# Patient Record
Sex: Female | Born: 1945 | Race: White | Hispanic: No | Smoking: Never smoker
Health system: Southern US, Community
[De-identification: ages and names within clinical notes are randomized; demographics above are authoritative.]

## PROBLEM LIST (undated history)

## (undated) DIAGNOSIS — F32A Depression, unspecified: Secondary | ICD-10-CM

## (undated) DIAGNOSIS — H04123 Dry eye syndrome of bilateral lacrimal glands: Secondary | ICD-10-CM

## (undated) DIAGNOSIS — K219 Gastro-esophageal reflux disease without esophagitis: Secondary | ICD-10-CM

## (undated) DIAGNOSIS — E559 Vitamin D deficiency, unspecified: Secondary | ICD-10-CM

## (undated) DIAGNOSIS — G250 Essential tremor: Secondary | ICD-10-CM

## (undated) DIAGNOSIS — R682 Dry mouth, unspecified: Secondary | ICD-10-CM

## (undated) DIAGNOSIS — K760 Fatty (change of) liver, not elsewhere classified: Secondary | ICD-10-CM

## (undated) DIAGNOSIS — J302 Other seasonal allergic rhinitis: Secondary | ICD-10-CM

## (undated) DIAGNOSIS — G43909 Migraine, unspecified, not intractable, without status migrainosus: Secondary | ICD-10-CM

## (undated) DIAGNOSIS — R739 Hyperglycemia, unspecified: Secondary | ICD-10-CM

## (undated) DIAGNOSIS — E119 Type 2 diabetes mellitus without complications: Secondary | ICD-10-CM

## (undated) DIAGNOSIS — R519 Headache, unspecified: Secondary | ICD-10-CM

## (undated) DIAGNOSIS — R51 Headache: Secondary | ICD-10-CM

## (undated) DIAGNOSIS — G4730 Sleep apnea, unspecified: Secondary | ICD-10-CM

## (undated) DIAGNOSIS — R072 Precordial pain: Secondary | ICD-10-CM

## (undated) DIAGNOSIS — K649 Unspecified hemorrhoids: Secondary | ICD-10-CM

## (undated) DIAGNOSIS — F419 Anxiety disorder, unspecified: Secondary | ICD-10-CM

## (undated) DIAGNOSIS — R059 Cough, unspecified: Secondary | ICD-10-CM

## (undated) DIAGNOSIS — E669 Obesity, unspecified: Secondary | ICD-10-CM

## (undated) DIAGNOSIS — R7303 Prediabetes: Secondary | ICD-10-CM

## (undated) DIAGNOSIS — M25561 Pain in right knee: Secondary | ICD-10-CM

## (undated) DIAGNOSIS — I10 Essential (primary) hypertension: Secondary | ICD-10-CM

## (undated) DIAGNOSIS — K8010 Calculus of gallbladder with chronic cholecystitis without obstruction: Secondary | ICD-10-CM

## (undated) DIAGNOSIS — F329 Major depressive disorder, single episode, unspecified: Secondary | ICD-10-CM

## (undated) DIAGNOSIS — R05 Cough: Secondary | ICD-10-CM

## (undated) HISTORY — PX: TONSILLECTOMY AND ADENOIDECTOMY: SUR1326

## (undated) HISTORY — DX: Hyperglycemia, unspecified: R73.9

## (undated) HISTORY — DX: Pain in right knee: M25.561

## (undated) HISTORY — DX: Essential (primary) hypertension: I10

## (undated) HISTORY — DX: Anxiety disorder, unspecified: F41.9

## (undated) HISTORY — DX: Obesity, unspecified: E66.9

## (undated) HISTORY — DX: Unspecified hemorrhoids: K64.9

## (undated) HISTORY — PX: CARPAL TUNNEL RELEASE: SHX101

## (undated) HISTORY — DX: Essential tremor: G25.0

## (undated) HISTORY — DX: Prediabetes: R73.03

## (undated) HISTORY — DX: Migraine, unspecified, not intractable, without status migrainosus: G43.909

## (undated) HISTORY — DX: Vitamin D deficiency, unspecified: E55.9

## (undated) HISTORY — DX: Depression, unspecified: F32.A

## (undated) HISTORY — DX: Gastro-esophageal reflux disease without esophagitis: K21.9

## (undated) HISTORY — DX: Other seasonal allergic rhinitis: J30.2

## (undated) HISTORY — DX: Major depressive disorder, single episode, unspecified: F32.9

## (undated) HISTORY — PX: TUBAL LIGATION: SHX77

## (undated) HISTORY — PX: OTHER SURGICAL HISTORY: SHX169

## (undated) HISTORY — DX: Calculus of gallbladder with chronic cholecystitis without obstruction: K80.10

## (undated) HISTORY — DX: Precordial pain: R07.2

---

## 1995-11-10 HISTORY — PX: INCONTINENCE SURGERY: SHX676

## 2000-07-04 ENCOUNTER — Emergency Department (HOSPITAL_COMMUNITY): Admission: EM | Admit: 2000-07-04 | Discharge: 2000-07-04 | Payer: Self-pay | Admitting: Emergency Medicine

## 2000-07-04 ENCOUNTER — Encounter: Payer: Self-pay | Admitting: Emergency Medicine

## 2002-07-06 ENCOUNTER — Other Ambulatory Visit: Admission: RE | Admit: 2002-07-06 | Discharge: 2002-07-06 | Payer: Self-pay | Admitting: Obstetrics and Gynecology

## 2003-07-11 ENCOUNTER — Other Ambulatory Visit: Admission: RE | Admit: 2003-07-11 | Discharge: 2003-07-11 | Payer: Self-pay | Admitting: Obstetrics and Gynecology

## 2004-08-06 ENCOUNTER — Other Ambulatory Visit: Admission: RE | Admit: 2004-08-06 | Discharge: 2004-08-06 | Payer: Self-pay | Admitting: Obstetrics and Gynecology

## 2004-10-06 ENCOUNTER — Encounter (INDEPENDENT_AMBULATORY_CARE_PROVIDER_SITE_OTHER): Payer: Self-pay | Admitting: Specialist

## 2004-10-06 ENCOUNTER — Ambulatory Visit (HOSPITAL_COMMUNITY): Admission: RE | Admit: 2004-10-06 | Discharge: 2004-10-06 | Payer: Self-pay | Admitting: Gastroenterology

## 2006-11-09 HISTORY — PX: TRIGGER FINGER RELEASE: SHX641

## 2006-11-17 ENCOUNTER — Encounter: Admission: RE | Admit: 2006-11-17 | Discharge: 2006-11-17 | Payer: Self-pay | Admitting: Gastroenterology

## 2008-08-30 ENCOUNTER — Ambulatory Visit (HOSPITAL_BASED_OUTPATIENT_CLINIC_OR_DEPARTMENT_OTHER): Admission: RE | Admit: 2008-08-30 | Discharge: 2008-08-30 | Payer: Self-pay | Admitting: Orthopedic Surgery

## 2009-01-03 ENCOUNTER — Ambulatory Visit (HOSPITAL_BASED_OUTPATIENT_CLINIC_OR_DEPARTMENT_OTHER): Admission: RE | Admit: 2009-01-03 | Discharge: 2009-01-03 | Payer: Self-pay | Admitting: Family Medicine

## 2009-01-03 ENCOUNTER — Ambulatory Visit: Payer: Self-pay | Admitting: Diagnostic Radiology

## 2009-02-26 ENCOUNTER — Ambulatory Visit (HOSPITAL_BASED_OUTPATIENT_CLINIC_OR_DEPARTMENT_OTHER): Admission: RE | Admit: 2009-02-26 | Discharge: 2009-02-26 | Payer: Self-pay | Admitting: Orthopedic Surgery

## 2011-02-18 LAB — POCT I-STAT, CHEM 8
BUN: 15 mg/dL (ref 6–23)
Calcium, Ion: 1.11 mmol/L — ABNORMAL LOW (ref 1.12–1.32)
Chloride: 106 mEq/L (ref 96–112)
Creatinine, Ser: 0.9 mg/dL (ref 0.4–1.2)
Glucose, Bld: 108 mg/dL — ABNORMAL HIGH (ref 70–99)
HCT: 47 % — ABNORMAL HIGH (ref 36.0–46.0)
Hemoglobin: 16 g/dL — ABNORMAL HIGH (ref 12.0–15.0)
Potassium: 4.1 mEq/L (ref 3.5–5.1)
Sodium: 139 mEq/L (ref 135–145)
TCO2: 25 mmol/L (ref 0–100)

## 2011-03-24 NOTE — Op Note (Signed)
NAMEKEARY, Heidi Byrd                ACCOUNT NO.:  [id number]   MEDICAL RECORD NO.:  [id number]          PATIENT TYPE:  AMB   LOCATION:  [place]                          FACILITY:  [facility]   PHYSICIAN:  Daryll Brod, M.D.       DATE OF BIRTH:  07/03/1946   DATE OF PROCEDURE:  08/30/2008  DATE OF DISCHARGE:                               OPERATIVE REPORT   PREOPERATIVE DIAGNOSIS:  Stenosing tenosynovitis, right thumb, index,  and middle fingers.   POSTOPERATIVE DIAGNOSIS:  Stenosing tenosynovitis, right thumb, index,  and middle fingers.   OPERATION:  Release of A1 pulleys, right thumb, index, and middle  fingers.   SURGEON:  Daryll Brod, MD   ASSISTANT:  Dimas Millin, RN   ANESTHESIA:  Forearm-based IV regional.   ANESTHESIOLOGIST:  Crissie Sickles. Conrad [place], MD.   HISTORY:  The patient is a 65 year old female referred by Dr. __________  for consultation with respect triggering of her index, middle, and  thumb.  She has a locked thumb, unable to fully flex or extend.  She is  desirous of surgical intervention with respect to these.  She is aware  of risks and complications including infection, recurrence injury to  arteries, nerves, tendons, incomplete relief of symptoms, and dystrophy.  In the preoperative area, the patient is seen.  The extremity was marked  by both the patient and surgeon.  Antibiotic was given.   PROCEDURE:  The patient was brought to the operating room where forearm-  based IV regional anesthetic was carried out without difficulty under  the direction of Dr. Conrad [place].  A time-out was taken.  Prep was done with  DuraPrep and after adequate anesthesia was afforded to the patient, a  transverse incision was made over the A1 pulley of the right thumb and  carried down through the subcutaneous tissue.  Neurovascular structures  were identified and protected.  Retractors were placed.  An incision was  then made on the radial aspect of the A1 pulley.  This was found to be  extremely tight  with an area of swelling of the tendon just proximal to  the A1 pulley.  The oblique pulley was left intact.  Thumb was easily  able to place through a full range of motion.  Wound was irrigated.  A  separate incision was then made over the A1 pulley of the index finger,  this was oblique in nature and carried down through the subcutaneous  tissue.  Bleeders were again electrocauterized.  Neurovascular  structures were identified and protected.  The A1 pulley was released on  its radial aspect.  A small incision was made centrally in A2.  __________ significant tenosynovitis was present.  This was debrided.  There was adherence between the 2 tendons through the tenosynovial  tissue.  A separate incision was then made over the middle finger A1  pulley and carried down through the subcutaneous tissue, oblique  incision having been opened.  Neurovascular structures were protected  with retractors.  The A1 pulley was then released in its radial aspect.  Again, a small incision was made in the  central aspect of A2.  The  fingers were placed through a full range motion.  No further triggering  was evident.  The wounds were copiously irrigated with saline.  Skin was  closed interrupted 5-0 Vicryl Rapide sutures.  A sterile  compressive dressing was applied with the fingers free.  The patient  tolerated the procedure well and was taken to the recovery room for  observation in satisfactory condition.   She will be discharged home to return to the [place]  in 1 week on Vicodin. __________           ______________________________  Daryll Brod, M.D.     GK/MEDQ  D:  08/30/2008  T:  08/31/2008  Job:  [id number]   cc:   ?

## 2011-03-24 NOTE — Op Note (Signed)
Heidi Byrd, STANTZ                ACCOUNT NO.:  [id number]   MEDICAL RECORD NO.:  [id number]          PATIENT TYPE:  AMB   LOCATION:  [place]                          FACILITY:  [facility]   PHYSICIAN:  Daryll Brod, M.D.       DATE OF BIRTH:  Jan 02, 1946   DATE OF PROCEDURE:  02/26/2009  DATE OF DISCHARGE:                               OPERATIVE REPORT   PREOPERATIVE DIAGNOSIS:  Stenosing tenosynovitis, left thumb.   POSTOPERATIVE DIAGNOSIS:  Stenosing tenosynovitis, left thumb.   OPERATION:  Release of A1 pulley, left thumb.   SURGEON:  Daryll Brod, MD   ASSISTANT:  Dimas Millin, RN   ANESTHESIA:  General.   ANESTHESIOLOGIST:  Ala Dach, MD   HISTORY:  The patient is a 65 year old female with history of triggering  bilateral thumbs, this has not responded to conservative treatment.  She  has undergone release of the right.  She was now admitted for release of  the left.  Pre, peri, and postoperative course have been discussed along  with risks and complications.  She is aware that there is no guarantee  with surgery, possibility of infection, recurrence injury to arteries,  nerves, tendons, complete relief of symptoms, dystrophy.  In  preoperative area, the patient is seen.  The extremity marked by both  the patient and surgeon.  Antibiotic given.   PROCEDURE:  The patient was brought to the operating room where general  anesthetic was carried out under the direction of Dr. Orene Desanctis.  She was  prepped using ChloraPrep, supine position, left arm free.  A 3-minute  dry time was taken.  Time-out was then taken confirming the patient  procedure.  She was draped.  The limb was exsanguinated with an Esmarch  bandage, tourniquet was placed on the forearm, was inflated to 230 mmHg.  Transverse incision was made over the A1 pulley of left thumb, carried  down through subcutaneous tissue.  Neurovascular structures were  identified and retracted.  A release was then performed to the A1 pulley  in its radial aspect.  The oblique pulley was protected and left intact.  The wound was copiously irrigated with saline, locally infiltrated with  cord percent Marcaine without epinephrine.  The wound was closed with  interrupted 4-0 Vicryl  Rapide sutures.  Sterile compressive dressing was applied.  The patient  tolerated the procedure well, was taken to the recovery for observation  in satisfactory condition.  She will be discharged home to return to  [place] in 1 week on ibuprofen and Darvocet-N 100.           ______________________________  Daryll Brod, M.D.     GK/MEDQ  D:  02/26/2009  T:  02/26/2009  Job:  [id number]   cc:   Herbie Baltimore L. Maceo Pro, M.D.

## 2011-03-27 NOTE — Op Note (Signed)
[name], [name]                ACCOUNT NO.:  [id number]   MEDICAL RECORD NO.:  [id number]          PATIENT TYPE:  AMB   LOCATION:  [place]                         FACILITY:  [facility]   PHYSICIAN:  Ronald Lobo, M.D.   DATE OF BIRTH:  May 01, 1946   DATE OF PROCEDURE:  10/06/2004  DATE OF DISCHARGE:                                 OPERATIVE REPORT   PROCEDURE:  Colonoscopy with biopsy and directed submucosal injection.   INDICATIONS FOR PROCEDURE:  A 65 year old female for colon cancer screening.  There is a family history of colon polyps.   FINDINGS:  Several small polyps removed plus an erythematous, friable larger  polypoid lesion near the rectosigmoid junction.  Sigmoid diverticulosis.   DESCRIPTION OF PROCEDURE:  The nature, purpose, and risks of the procedure  had been discussed with the patient who provided written consent.  Total  sedation for this procedure and the upper endoscopy which preceded it was  Fentanyl 125 mcg and Versed 12 mg IV to a level of mild-to-moderate sedation  without clinical instability.  The Olympus adult videocolonoscope was  advanced to the cecum using a little bit of external abdominal compression  to enter the base of the cecum.  The terminal ileum was not entered, but the  orifice of the ileocecal valve was observed and appeared normal.   Pullback was then performed.  The quality of the prep was quite good, with  just a little bit of adherent stool in the cecum which was partially  irrigated away and which is not felt to have obscured any significant  lesions.   The main finding on this exam was at 20 cm from the external anal opening,  and consisted of a roughly 8 x 20-mm erythematous, friable polypoid, soft,  fleshy mass with overlying white exudate as is sometimes seen with  hamartomas.  Several biopsies were obtained, and it bled rather freely.  It  was quite soft.  It was rather sessile and could not readily be separated  from the  underlying tissues, so I elected not to attempt to snare it off  within knowing more about its histologic character.   Nearby were numerous sigmoid diverticula noted during removal of the scope.  During removal of the scope, I injected SPOT Niger ink type solution to mark  the location of this lesion.  It is felt that the full biopsies of it did  not excise it in its entirety, so in case high-grade dysplasia were present,  the submucosal injections may help a surgeon localize the lesion.   There were also some diminutive sessile polyps a slight distance proximal to  this main lesion in the distal sigmoid region.   The remainder of the colon was unremarkable.  No frank cancer or other  polyps were noted.   Retroflexion in the rectum and reinspection of the rectum were unremarkable.   The patient tolerated the procedure well, and there were no apparent  complications.   IMPRESSION:  1.  Polypoid erythematous lesion at 20 cm of uncertain clinical      significance, pathology pending and  site marked, as described above.  2.  Diminutive distal sigmoid sessile polyps which appear grossly      hyperplastic in character.  3.  Moderate sigmoid diverticulosis.  4.  Family history of colon polyps.   PLAN:  Await pathology results.      RB/MEDQ  D:  10/06/2004  T:  10/06/2004  Job:  [id number]   cc:   Herbie Baltimore L. Maceo Pro, M.D.  [address] [place]  [place] [address]  Fax: [phone]

## 2011-03-27 NOTE — Op Note (Signed)
[name], Heidi Byrd                ACCOUNT NO.:  [id number]   MEDICAL RECORD NO.:  [id number]          PATIENT TYPE:  AMB   LOCATION:  [place]                         FACILITY:  [facility]   PHYSICIAN:  Ronald Lobo, M.D.   DATE OF BIRTH:  02/01/1946   DATE OF PROCEDURE:  10/06/2004  DATE OF DISCHARGE:                                 OPERATIVE REPORT   PROCEDURE:  Upper endoscopy with biopsy.   INDICATIONS:  Follow up of previous gastric polyps diagnosed in [place], in  a patient with a history of reflux maintained on PPI therapy.   FINDINGS:  Medium sized mid-gastric polyps biopsied.   PROCEDURE:  The nature, purpose and risks of the procedure were reviewed  with the patient and provided consent.  Sedation with Fentanyl 50 mcg, and  Versed 7 mg IV.  Without any clinical instability, the Olympus video  endoscope was passed under direct vision.  The vocal cords were not well  seen.  The esophagus was removed without any significant difficulty and he  had entirely normal mucosa down to the squamocolumnar junction which was  located essentially at the area of the lower esophageal sphincter, without  any hiatal hernia evident.   There was no free reflux or evidence of reflux esophagitis, Barrett's  esophagus, varices, infection or neoplasia.  No rings or strictures were  seen.   The stomach contained a minimal fluid residual which was suctioned up.  There were some medium sized, roughly 1 cm (fleshy gastric polyps in the mid-  stomach, several of which I cold biopsied).  There were probably 6 or 8 such  polyps present.  No frank gastric mass was appreciated and there was no  evidence of gastritis, erosions or ulcers including a retropexy with a  proximal stomach.  The pylorus and duodenal vault and second duodenum looked  normal.   The patient tolerated the procedure well with no apparent complications.   IMPRESSION:  1.  Medium sized gastric polyps in the mid-body of the stomach  (211.1).  2.  No evidence of reflux induced sequelae in the esophagus.   PLAN:  Await pathology of gastric polyps.      RB/MEDQ  D:  10/06/2004  T:  10/06/2004  Job:  [id number]   cc:   Herbie Baltimore L. Maceo Pro, M.D.  [address] [place]  [place] [address]  Fax: [phone]

## 2011-05-12 ENCOUNTER — Other Ambulatory Visit: Payer: Self-pay | Admitting: Obstetrics and Gynecology

## 2011-08-11 LAB — BASIC METABOLIC PANEL
CO2: 31
Calcium: 9.6
GFR calc Af Amer: >60
Sodium: 140

## 2011-08-11 LAB — POCT HEMOGLOBIN-HEMACUE: Hemoglobin: 14.1

## 2011-12-02 ENCOUNTER — Other Ambulatory Visit (HOSPITAL_BASED_OUTPATIENT_CLINIC_OR_DEPARTMENT_OTHER): Payer: Self-pay | Admitting: Family Medicine

## 2011-12-03 ENCOUNTER — Ambulatory Visit (HOSPITAL_BASED_OUTPATIENT_CLINIC_OR_DEPARTMENT_OTHER)
Admission: RE | Admit: 2011-12-03 | Discharge: 2011-12-03 | Disposition: A | Discharge status: Home / Self Care | Payer: PRIVATE HEALTH INSURANCE | Source: Ambulatory Visit | Attending: Family Medicine | Admitting: Family Medicine

## 2011-12-03 DIAGNOSIS — R1032 Left lower quadrant pain: Secondary | ICD-10-CM

## 2011-12-03 DIAGNOSIS — K7689 Other specified diseases of liver: Secondary | ICD-10-CM | POA: Insufficient documentation

## 2011-12-03 DIAGNOSIS — K5732 Diverticulitis of large intestine without perforation or abscess without bleeding: Secondary | ICD-10-CM | POA: Insufficient documentation

## 2011-12-03 DIAGNOSIS — R197 Diarrhea, unspecified: Secondary | ICD-10-CM | POA: Insufficient documentation

## 2011-12-03 DIAGNOSIS — R109 Unspecified abdominal pain: Secondary | ICD-10-CM | POA: Insufficient documentation

## 2011-12-03 MED ORDER — IOHEXOL 300 MG/ML  SOLN
100.0000 mL | Freq: Once | INTRAMUSCULAR | Status: AC | PRN
  Administered 2011-12-03: 100 mL via INTRAVENOUS

## 2013-09-14 ENCOUNTER — Ambulatory Visit (INDEPENDENT_AMBULATORY_CARE_PROVIDER_SITE_OTHER): Payer: Medicare Other | Admitting: Cardiology

## 2013-09-14 ENCOUNTER — Encounter: Payer: Self-pay | Admitting: Cardiology

## 2013-09-14 VITALS — BP 132/84 | HR 63 | Ht 63.0 in | Wt 229.0 lb

## 2013-09-14 DIAGNOSIS — R072 Precordial pain: Secondary | ICD-10-CM

## 2013-09-14 HISTORY — DX: Precordial pain: R07.2

## 2013-09-14 NOTE — Progress Notes (Signed)
HPI The patient presents for evaluation of chest discomfort. She has no prior cardiac history. She reports an episode of chest discomfort nothing one week ago. This was unlike her previous reflux. He was a upper left chest discomfort. It was 4/10 in intensity. It was not worse with movement or deep breathing. There was no radiation to the neck or the jaw or the arm. It was dull with some sporadic shooting pains. There was no associated symptoms such as nausea vomiting or diaphoresis. There were no palpitations, presyncope or syncope. It came on spontaneously and when we after about 3 hours. She has not had any of this before. She's not had any of this since then. She did see her primary care doctor and there was some subtle T-wave change apparently on the EKG though I don't have this one for comparison.   Allergies  Allergen Reactions  . Hydrocodone-Acetaminophen     DYSPNEA    Current Outpatient Prescriptions  Medication Sig Dispense Refill  . bisoprolol-hydrochlorothiazide (ZIAC) 10-6.25 MG per tablet Take 1 tablet by mouth daily.      . Calcium Carbonate-Vitamin D (CALCIUM + D PO) Take 600 mg by mouth daily.      Marland Kitchen esomeprazole (NEXIUM) 20 MG capsule Take 20 mg by mouth daily at 12 noon.      . fexofenadine (ALLEGRA) 180 MG tablet Take 180 mg by mouth as needed.       Marland Kitchen ibuprofen (ADVIL,MOTRIN) 800 MG tablet Take 800 mg by mouth every 8 (eight) hours as needed.       No current facility-administered medications for this visit.    Past Medical History  Diagnosis Date  . Hypertension   . GERD (gastroesophageal reflux disease)   . Seasonal allergic reaction   . Kidney stones     Past Surgical History  Procedure Laterality Date  . Scr colon      Family History  Problem Relation Age of Onset  . Diabetes Mother   . Hypertension Mother   . Osteoporosis Mother     History   Social History  . Marital Status: Divorced    Spouse Name: N/A    Number of Children: N/A  . Years  of Education: N/A   Occupational History  . Not on file.   Social History Main Topics  . Smoking status: Never Smoker   . Smokeless tobacco: Not on file  . Alcohol Use: No  . Drug Use: No  . Sexual Activity: Not on file   Other Topics Concern  . Not on file   Social History Narrative  . No narrative on file    ROS:  Joint pains, headaches, reflux. Otherwise as stated in the HPI and negative for all other systems.  PHYSICAL EXAM BP 132/84  Pulse 63  Ht 5' 3"  (1.6 m)  Wt 229 lb (103.874 kg)  BMI 40.58 kg/m2 GENERAL:  Well appearing HEENT:  Pupils equal round and reactive, fundi not visualized, oral mucosa unremarkable NECK:  No jugular venous distention, waveform within normal limits, carotid upstroke brisk and symmetric, no bruits, no thyromegaly LYMPHATICS:  No cervical, inguinal adenopathy LUNGS:  Clear to auscultation bilaterally BACK:  No CVA tenderness CHEST:  Unremarkable HEART:  PMI not displaced or sustained,S1 and S2 within normal limits, no S3, no S4, no clicks, no rubs, no murmurs ABD:  Flat, positive bowel sounds normal in frequency in pitch, no bruits, no rebound, no guarding, no midline pulsatile mass, no hepatomegaly, no splenomegaly EXT:  2 plus pulses throughout, no edema, no cyanosis no clubbing SKIN:  No rashes no nodules NEURO:  Cranial nerves II through XII grossly intact, motor grossly intact throughout PSYCH:  Cognitively intact, oriented to person place and time   EKG: Sinus rhythm, rate 63, axis within normal limits, intervals within normal limits, no acute ST-T wave changes.  09/14/2013   ASSESSMENT AND PLAN  CHEST PAIN:  I think the pretest probability of obstructive coronary disease is relatively low. I will bring the patient back for a POET (Plain Old Exercise Test). This will allow me to screen for obstructive coronary disease, risk stratify and very importantly provide a prescription for exercise.  OVERWEIGHT:  The patient understands the  need to lose weight with diet and exercise. We have discussed specific strategies for this.

## 2013-09-14 NOTE — Patient Instructions (Signed)
Your physician has requested that you have an exercise tolerance test. Can be done with NP or PA. For further information please visit [email or web address] Please also follow instruction sheet, as given.  Your physician recommends that you schedule a follow-up appointment as needed with Dr. Antoine Poche

## 2013-09-26 ENCOUNTER — Encounter: Payer: Medicare Other | Admitting: Cardiology

## 2013-11-27 ENCOUNTER — Ambulatory Visit (INDEPENDENT_AMBULATORY_CARE_PROVIDER_SITE_OTHER): Payer: Medicare Other | Admitting: Cardiology

## 2013-11-27 DIAGNOSIS — R072 Precordial pain: Secondary | ICD-10-CM

## 2013-11-27 NOTE — Progress Notes (Signed)
Exercise Treadmill Test  Pre-Exercise Testing Evaluation Rhythm: normal sinus  Rate: 71     Test  Exercise Tolerance Test Ordering MD: Marijo File, MD  Interpreting MD: Marijo File, MD  Unique Test No: 1  Treadmill:  1  Indication for ETT: precordial chest pain  Contraindication to ETT: No   Stress Modality: exercise - treadmill  Cardiac Imaging Performed: non   Protocol: standard Bruce - maximal  Max BP:  216/84  Max MPHR (bpm):  155 85% MPR (bpm):  132  MPHR obtained (bpm):  136 % MPHR obtained:  88  Reached 85% MPHR (min:sec):  5:10 Total Exercise Time (min-sec):  5:30  Workload in METS:  7 Borg Scale: 18  Reason ETT Terminated:  desired heart rate attained    ST Segment Analysis At Rest: normal ST segments - no evidence of significant ST depression With Exercise: no evidence of significant ST depression  Other Information Arrhythmia:  No Angina during ETT:  absent (0) Quality of ETT:  diagnostic  ETT Interpretation:  normal - no evidence of ischemia by ST analysis  Comments: The patient had poor exercise tolerance.  There was no chest pain.  There was an appropriate level of dyspnea.  There were no arrhythmias, a normal heart rate response and normal BP response.  There were no ischemic ST T wave changes and a normal heart rate recovery.  Recommendations: Negative adequate ETT.  No further testing is indicated.  Based on the above I gave the patient a prescription for exercise.

## 2014-06-14 ENCOUNTER — Ambulatory Visit
Admission: RE | Admit: 2014-06-14 | Discharge: 2014-06-14 | Disposition: A | Discharge status: Home / Self Care | Payer: Medicare Other | Source: Ambulatory Visit | Attending: Sports Medicine | Admitting: Sports Medicine

## 2014-06-14 ENCOUNTER — Encounter: Payer: Self-pay | Admitting: Sports Medicine

## 2014-06-14 ENCOUNTER — Ambulatory Visit (INDEPENDENT_AMBULATORY_CARE_PROVIDER_SITE_OTHER): Payer: Medicare Other | Admitting: Sports Medicine

## 2014-06-14 VITALS — BP 163/86 | HR 68 | Ht 63.0 in | Wt 233.0 lb

## 2014-06-14 DIAGNOSIS — M25569 Pain in unspecified knee: Secondary | ICD-10-CM

## 2014-06-14 DIAGNOSIS — M25561 Pain in right knee: Secondary | ICD-10-CM

## 2014-06-14 HISTORY — DX: Pain in right knee: M25.561

## 2014-06-14 NOTE — Progress Notes (Signed)
  Heidi Byrd - 68 y.o. female MRN [id number]  Date of birth: Oct 01, 1946  SUBJECTIVE:  Including CC & ROS.  Chief Complaint  Patient presents with  . Knee Pain    right knee pain    68 y/o obese female with PMH HTN presented with 2 weeks of right medial > lateral knee pain and tightness. No known inciting event/injury/trauma. She does note that at about the time that pain began, she had been wearing a different pair of her "nice white shoes." No locking but has appreciated a "clunking" sensation at times over the past 2 weeks. Pain aggravated by walking and going up or down stairs. Heat helped with the pain initially but does not seem to help now. Ice and ibuprofen 400 mg BID are helping now.   Of note, she had right knee pain in 2011. No imaging obtained. She was diagnosed with "internal derangement" and prescribed a HEP, which resulted in resolution of her pain.  She had left knee pain in 2014, which was diagnosed as tendinitis, per her report.    ROS: Gen: no fevers, chills, weight changes CV: no CP Resp: no SOB Neuro: no focal numbness, tingling or weakness     HISTORY: Past Medical, Surgical, Social, and Family History Reviewed & Updated per EMR.   Pertinent Historical Findings include: Obesity Pre-diabetes Right knee tendonitis  Grandfather with bone CA (somewhere in leg)  DATA REVIEWED: No prior imaging of right knee.   PHYSICAL EXAM:  VS: BP:163/86 mmHg  HR:68bpm  TEMP: ( )  RESP:   HT:5' 3"  (160 cm)   WT:233 lb (105.688 kg)  BMI:41.4 PHYSICAL EXAM: Gen: well appearing, NAD MSK: Right knee: Inspection: mild genu valgus; no overlying skin changes (erythema, bruising) Palpation: mild effusion; crepitus present; TTP most prominent at medial joint line, but also lateral joint line and tibial tubercle/insertion of patellar tensdon ROM: limited extension  Strength: 5/5 in LE Neurovascular: antalgic gait; sensation intact; well perfused  Special Tests: Thesaly's:  unable to perform 2/2 to pain (can bear weight on right leg but not flex) McMurry's: pain along lateral joint line > medial joint line; no popping palpated  Ligamentous testing: Pain along medial joint line with valgus stress. ACL, PCL, MCL and LCL intact with good endpoints.    ASSESSMENT & PLAN: See problem based charting & AVS for pt instructions.  1. Right medial knee pain - DG Knee AP/LAT W/Sunrise Right; Future  History and exam suggestive of knee osteoarthritis, possibly with degenerative medial meniscus tear. Compression sleeve applied today. Home exercises for quad and hamstring strengthening demonstrated and provided in a handout. Advised continued use of ibuprofen, as this has been helping and patient is hesitant to take medications. Obtain Xrays and f/u in 2 weeks. Will consider steroid injection at that time.    Susannah Lichtenstein, DO, HO-3

## 2014-06-14 NOTE — Patient Instructions (Signed)
Suspect this is related to arthritis in your knee and possibly a degenerative tear of your meniscus  We will obtain Xrays of your knee   Wear compression sleeve, applied today  Home exercises, as discussed and demonstrated today, paying particular attention to quads and hamstrings.   Continue ibuprofen  Return in 2 weeks. We will review the Xray and can consider steroid injection.

## 2014-06-18 ENCOUNTER — Telehealth: Payer: Self-pay | Admitting: *Deleted

## 2014-06-18 NOTE — Telephone Encounter (Signed)
Gave pt xray results, has appt with Dr. Micheline Chapman next week

## 2014-06-25 ENCOUNTER — Ambulatory Visit: Payer: Medicare Other | Admitting: Sports Medicine

## 2014-06-27 ENCOUNTER — Encounter: Payer: Self-pay | Admitting: Sports Medicine

## 2014-06-27 ENCOUNTER — Ambulatory Visit (INDEPENDENT_AMBULATORY_CARE_PROVIDER_SITE_OTHER): Payer: Medicare Other | Admitting: Sports Medicine

## 2014-06-27 VITALS — BP 141/83 | HR 68 | Ht 63.0 in | Wt 232.0 lb

## 2014-06-27 DIAGNOSIS — M25561 Pain in right knee: Secondary | ICD-10-CM

## 2014-06-27 DIAGNOSIS — M25569 Pain in unspecified knee: Secondary | ICD-10-CM

## 2014-06-28 NOTE — Progress Notes (Signed)
   Subjective:    Patient ID: Heidi Byrd, female    DOB: 14-Mar-1946, 68 y.o.   MRN: [id number]  HPI Patient comes in today for followup on right knee pain. She is still struggling with pain. In addition to pain she is also getting significant mechanical symptoms. She will feel the knee lock up on her which requires her to manipulate the knee in order to get it to "pop". After this pop she is then able to move much easier. Continues with intermittent swelling as well. She is wearing her body helix compression sleeve intermittently. It is somewhat uncomfortable. She is also taking very low doses of ibuprofen. She has been cautioned in the past by other physicians about taking high doses of anti-inflammatories. She does note that the ibuprofen helps her sleep at night.    Review of Systems     Objective:   Physical Exam Obese. No acute distress  Right knee: Range of motion is 0-120. 1+ effusion. Tender to palpation along the medial joint line with a positive McMurray's. Knee is grossly stable ligamentous exam. Neurovascularly intact distally. Walking with a limp.  X-rays of the right knee show some mild medial compartmental narrowing but not marked. Otherwise unremarkable.       Assessment & Plan:  Persistent right knee pain and swelling worrisome for medial meniscal tear  Given her persistent symptoms and physical exam I think we need to pursue an MRI scan of her right knee to rule out meniscal pathology that may benefit from arthroscopy. I will call her with those results once available at which point we will delineate further treatment. In the meantime, she will continue with activity as tolerated but is cautioned about doing too much recreational walking. I've also cautioned her about taking too much ibuprofen. She is asking about using Arnicare which is some sort of topical treatment. I think it is fine for her to try this.

## 2014-06-29 ENCOUNTER — Ambulatory Visit
Admission: RE | Admit: 2014-06-29 | Discharge: 2014-06-29 | Disposition: A | Discharge status: Home / Self Care | Payer: Medicare Other | Source: Ambulatory Visit | Attending: Sports Medicine | Admitting: Sports Medicine

## 2014-06-29 DIAGNOSIS — M25561 Pain in right knee: Secondary | ICD-10-CM

## 2014-07-02 ENCOUNTER — Telehealth: Payer: Self-pay | Admitting: Sports Medicine

## 2014-07-02 NOTE — Telephone Encounter (Signed)
I spoke with the patient on the phone today regarding MRI findings of her right knee. She has an oblique undersurface tear of the body and posterior horn of the medial meniscus with a flipped fragment in the inferior coronary recess. 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 Patient continues to experience pain and mechanical symptoms in her right knee. I think she would benefit from arthroscopy. I will refer her to Dr. Noemi Chapel to discuss this in further detail. Further treatment of this knee will be per Dr. Archie Endo discretion. Patient will followup with me when necessary.

## 2014-07-02 NOTE — Telephone Encounter (Signed)
Message copied by Thurman Coyer on Mon Jul 02, 2014  1:11 PM ------      Message from: Cyd Silence      Created: Mon Jul 02, 2014 11:21 AM      Regarding: FW: mri results      Contact: [phone]                   ----- Message -----         From: Carolyne Littles         Sent: 07/02/2014  11:08 AM           To: April Manson, CMA      Subject: mri results                                              Pt called to get her results of rt knee MRI. ------

## 2014-07-03 ENCOUNTER — Encounter: Payer: Self-pay | Admitting: *Deleted

## 2014-07-03 NOTE — Progress Notes (Signed)
Patient ID: Heidi Byrd, female   DOB: 19-Nov-1945, 68 y.o.   MRN: [id number] [place] [address] [address] DR Bonner Puna 07/05/14 @ 915AM [phone]

## 2014-10-05 ENCOUNTER — Encounter (HOSPITAL_BASED_OUTPATIENT_CLINIC_OR_DEPARTMENT_OTHER): Payer: Self-pay | Admitting: *Deleted

## 2014-10-05 ENCOUNTER — Emergency Department (HOSPITAL_BASED_OUTPATIENT_CLINIC_OR_DEPARTMENT_OTHER): Payer: Medicare Other

## 2014-10-05 ENCOUNTER — Emergency Department (HOSPITAL_BASED_OUTPATIENT_CLINIC_OR_DEPARTMENT_OTHER)
Admission: EM | Admit: 2014-10-05 | Discharge: 2014-10-05 | Disposition: A | Discharge status: Home / Self Care | Payer: Medicare Other | Attending: Emergency Medicine | Admitting: Emergency Medicine

## 2014-10-05 DIAGNOSIS — R112 Nausea with vomiting, unspecified: Secondary | ICD-10-CM | POA: Insufficient documentation

## 2014-10-05 DIAGNOSIS — Z87442 Personal history of urinary calculi: Secondary | ICD-10-CM | POA: Insufficient documentation

## 2014-10-05 DIAGNOSIS — I10 Essential (primary) hypertension: Secondary | ICD-10-CM | POA: Diagnosis not present

## 2014-10-05 DIAGNOSIS — Z79899 Other long term (current) drug therapy: Secondary | ICD-10-CM | POA: Diagnosis not present

## 2014-10-05 DIAGNOSIS — Z9851 Tubal ligation status: Secondary | ICD-10-CM | POA: Insufficient documentation

## 2014-10-05 DIAGNOSIS — Z880 Allergy status to penicillin: Secondary | ICD-10-CM | POA: Diagnosis not present

## 2014-10-05 DIAGNOSIS — Z8659 Personal history of other mental and behavioral disorders: Secondary | ICD-10-CM | POA: Insufficient documentation

## 2014-10-05 DIAGNOSIS — Z791 Long term (current) use of non-steroidal anti-inflammatories (NSAID): Secondary | ICD-10-CM | POA: Diagnosis not present

## 2014-10-05 DIAGNOSIS — R1013 Epigastric pain: Secondary | ICD-10-CM | POA: Insufficient documentation

## 2014-10-05 DIAGNOSIS — K219 Gastro-esophageal reflux disease without esophagitis: Secondary | ICD-10-CM | POA: Diagnosis not present

## 2014-10-05 LAB — COMPREHENSIVE METABOLIC PANEL
ALK PHOS: 74 U/L (ref 39–117)
ALT: 29 U/L (ref 0–35)
ANION GAP: 14 (ref 5–15)
AST: 39 U/L — ABNORMAL HIGH (ref 0–37)
Albumin: 4.1 g/dL (ref 3.5–5.2)
BUN: 15 mg/dL (ref 6–23)
CALCIUM: 10 mg/dL (ref 8.4–10.5)
CO2: 24 mEq/L (ref 19–32)
CREATININE: 0.7 mg/dL (ref 0.50–1.10)
Chloride: 101 mEq/L (ref 96–112)
GFR calc non Af Amer: 88 mL/min — ABNORMAL LOW (ref >90)
GLUCOSE: 125 mg/dL — AB (ref 70–99)
Potassium: 4.5 mEq/L (ref 3.7–5.3)
Sodium: 139 mEq/L (ref 137–147)
TOTAL PROTEIN: 8.8 g/dL — AB (ref 6.0–8.3)
Total Bilirubin: 0.8 mg/dL (ref 0.3–1.2)

## 2014-10-05 LAB — URINALYSIS, ROUTINE W REFLEX MICROSCOPIC
Bilirubin Urine: NEGATIVE
Glucose, UA: NEGATIVE mg/dL
Hgb urine dipstick: NEGATIVE
Ketones, ur: NEGATIVE mg/dL
Nitrite: NEGATIVE
Protein, ur: NEGATIVE mg/dL
Specific Gravity, Urine: 1.021 (ref 1.005–1.030)
Urobilinogen, UA: 0.2 mg/dL (ref 0.0–1.0)
pH: 6 (ref 5.0–8.0)

## 2014-10-05 LAB — CBC WITH DIFFERENTIAL/PLATELET
Basophils Absolute: 0 10*3/uL (ref 0.0–0.1)
Basophils Relative: 1 % (ref 0–1)
EOS ABS: 0.2 10*3/uL (ref 0.0–0.7)
EOS PCT: 2 % (ref 0–5)
HCT: 44.6 % (ref 36.0–46.0)
HEMOGLOBIN: 14.3 g/dL (ref 12.0–15.0)
LYMPHS ABS: 1.7 10*3/uL (ref 0.7–4.0)
Lymphocytes Relative: 23 % (ref 12–46)
MCH: 28.3 pg (ref 26.0–34.0)
MCHC: 32.1 g/dL (ref 30.0–36.0)
MCV: 88.1 fL (ref 78.0–100.0)
MONO ABS: 0.8 10*3/uL (ref 0.1–1.0)
MONOS PCT: 11 % (ref 3–12)
Neutro Abs: 4.6 10*3/uL (ref 1.7–7.7)
Neutrophils Relative %: 63 % (ref 43–77)
Platelets: 318 10*3/uL (ref 150–400)
RBC: 5.06 MIL/uL (ref 3.87–5.11)
RDW: 13.9 % (ref 11.5–15.5)
WBC: 7.2 10*3/uL (ref 4.0–10.5)

## 2014-10-05 LAB — URINE MICROSCOPIC-ADD ON

## 2014-10-05 LAB — LIPASE, BLOOD: Lipase: 34 U/L (ref 11–59)

## 2014-10-05 MED ORDER — CEFDINIR 300 MG PO CAPS: 300.0000 mg | ORAL_CAPSULE | Freq: Two times a day (BID) | ORAL | Status: DC

## 2014-10-05 MED ORDER — IOHEXOL 300 MG/ML  SOLN
100.0000 mL | Freq: Once | INTRAMUSCULAR | Status: AC | PRN
  Administered 2014-10-05: 100 mL via INTRAVENOUS

## 2014-10-05 MED ORDER — CEPHALEXIN 500 MG PO CAPS: 500.0000 mg | ORAL_CAPSULE | Freq: Three times a day (TID) | ORAL | Status: DC

## 2014-10-05 MED ORDER — IOHEXOL 300 MG/ML  SOLN
50.0000 mL | Freq: Once | INTRAMUSCULAR | Status: AC | PRN
  Administered 2014-10-05: 50 mL via ORAL

## 2014-10-05 MED ORDER — CEFACLOR 500 MG PO CAPS: 500.0000 mg | ORAL_CAPSULE | Freq: Three times a day (TID) | ORAL | Status: DC

## 2014-10-05 MED ORDER — OXYCODONE-ACETAMINOPHEN 5-325 MG PO TABS: 1.0000 | ORAL_TABLET | ORAL | Status: DC | PRN

## 2014-10-05 NOTE — ED Provider Notes (Signed)
CSN: [id number]     Arrival date & time 10/05/14  1158 History   First MD Initiated Contact with Patient 10/05/14 1238     Chief Complaint  Patient presents with  . Abdominal Pain   Heidi Byrd is a 68 y.o. female presenting with abdominal pain.   For the past 2-3 weeks she has experienced gradually worsening, constant, now severe pain mostly in the epigastrium with radiation bilaterally to the back. Associated nausea and NBNB emesis x2. Has had normally timed and formed stools that are tan in color. No change inurinary habits. No constitutional symptoms. Nothing has helped the pain. She has severe GERD and recently changed to zantac from PPI.   Has history of gallstones without cholecystectomy. No other surgeries. Never had pancreatitis. Doesn't drink alcohol. Denies illicit drugs. Has never had UTIs or STIs, last sexual encounter was in 1994.   PT IS A JEHOVAH'S WITNESS  (Consider location/radiation/quality/duration/timing/severity/associated sxs/prior Treatment) HPI  Past Medical History  Diagnosis Date  . Hypertension   . GERD (gastroesophageal reflux disease)   . Seasonal allergic reaction   . Kidney stones   . Hyperglycemia   . Depression    Past Surgical History  Procedure Laterality Date  . Scr colon    . Tonsillectomy and adenoidectomy    . Incontinence surgery    . Tubal ligation    . Carpal tunnel release      Bilateral  . Trigger finger release      x 4   Family History  Problem Relation Age of Onset  . Diabetes Mother   . Hypertension Mother   . Osteoporosis Mother   . Bone cancer Maternal Grandfather    History  Substance Use Topics  . Smoking status: Never Smoker   . Smokeless tobacco: Never Used  . Alcohol Use: No   OB History    No data available     Review of Systems  Constitutional: Negative for fever and chills.  HENT: Negative for sneezing and sore throat.   Eyes: Negative for visual disturbance.  Respiratory: Negative for cough,  shortness of breath and wheezing.   Cardiovascular: Negative for chest pain and leg swelling.  Gastrointestinal: Positive for nausea, vomiting and abdominal pain. Negative for blood in stool, abdominal distention and rectal pain.       Tan stools  Endocrine: Negative for polydipsia and polyuria.  Genitourinary: Negative for dysuria, urgency, frequency, hematuria, flank pain, vaginal bleeding, vaginal discharge and vaginal pain.  Musculoskeletal: Negative for myalgias, arthralgias and neck pain.  Skin: Negative for rash.  Allergic/Immunologic: Negative for immunocompromised state.  Neurological: Negative for syncope, light-headedness and headaches.      Allergies  Tetracyclines & related; Augmentin; Calcium channel blockers; Codeine; and Hydrocodone-acetaminophen  Home Medications   Prior to Admission medications   Medication Sig Start Date End Date Taking? Authorizing Provider  LISINOPRIL PO Take by mouth.   Yes Historical Provider, MD  ranitidine (ZANTAC) 150 MG tablet Take 150 mg by mouth 2 (two) times daily.   Yes Historical Provider, MD  bisoprolol-hydrochlorothiazide (ZIAC) 10-6.25 MG per tablet Take 1 tablet by mouth daily.    Historical Provider, MD  Calcium Carbonate-Vitamin D (CALCIUM + D PO) Take 600 mg by mouth daily.    Historical Provider, MD  esomeprazole (NEXIUM) 20 MG capsule Take 20 mg by mouth daily at 12 noon.    Historical Provider, MD  fexofenadine (ALLEGRA) 180 MG tablet Take 180 mg by mouth as needed.  Historical Provider, MD  ibuprofen (ADVIL,MOTRIN) 800 MG tablet Take 800 mg by mouth every 8 (eight) hours as needed.    Historical Provider, MD   BP 154/85 mmHg  Pulse 79  Temp(Src) 98.2 F (36.8 C) (Oral)  Resp 18  Ht 5' 3"  (1.6 m)  Wt 232 lb (105.235 kg)  BMI 41.11 kg/m2  SpO2 99% Physical Exam  Constitutional: She is oriented to person, place, and time. She appears well-developed and well-nourished. No distress.  HENT:  Mouth/Throat: Oropharynx is  clear and moist.  Eyes: Conjunctivae and EOM are normal. Pupils are equal, round, and reactive to light. No scleral icterus.  Neck: Neck supple. No JVD present.  Cardiovascular: Normal rate, regular rhythm, normal heart sounds and intact distal pulses.   No murmur heard. Pulmonary/Chest: Effort normal and breath sounds normal. No respiratory distress. She exhibits no tenderness.  Abdominal: Soft. Bowel sounds are normal. She exhibits no distension and no mass. There is tenderness (epigastric L > R ). There is no rebound and no guarding.  Musculoskeletal: Normal range of motion. She exhibits no edema or tenderness.  Lymphadenopathy:    She has no cervical adenopathy.  Neurological: She is alert and oriented to person, place, and time. She exhibits normal muscle tone.  Skin: Skin is warm and dry.  Vitals reviewed.   ED Course  Procedures (including critical care time) Labs Review Labs Reviewed  URINALYSIS, ROUTINE W REFLEX MICROSCOPIC - Abnormal; Notable for the following:    Leukocytes, UA SMALL (*)    All other components within normal limits  COMPREHENSIVE METABOLIC PANEL - Abnormal; Notable for the following:    Glucose, Bld 125 (*)    Total Protein 8.8 (*)    AST 39 (*)    GFR calc non Af Amer 88 (*)    All other components within normal limits  CBC WITH DIFFERENTIAL  LIPASE, BLOOD  URINE MICROSCOPIC-ADD ON    Imaging Review No results found.   EKG Interpretation None      MDM   Final diagnoses:  Epigastric abdominal pain    68yo with subacute symptoms suggestive of epigastric inflammation. No SIRS and no signs of hepatocellular injury (trivial elevation of AST 39) or cholestasis. No thrombocytopenia to suggest splenomegaly. Will get CT abd w/ contrast.   CT shows signs of diverticulitis in pt with known diverticulosis and left sided abdominal tenderness on exam. Will treat with antibiotics and analgesics.   Dot Lanes, MD 10/06/14 [phone]

## 2014-10-05 NOTE — ED Notes (Signed)
abd pain for weeks- c/o tan stools x 2.5 weeks- spoke with her PCP and GI doctors and was advised to come to ED for CT scan- +nausea, vomited x 2 yesterday

## 2014-10-05 NOTE — Discharge Instructions (Signed)
We think your abdominal pain is due to diverticulitis, which requires antibiotic treatment for 10 days.  - Take cefdinir as directed twice daily for 10 days Your labs checking your kidneys, liver, pancreas were all unremarkable.  - STOP taking ibuprofen, naproxen, advil and other over the counter pain relievers. You should take tylenol instead as it does not contribute to gastric ulceration.

## 2014-11-09 HISTORY — PX: EYE SURGERY: SHX253

## 2014-11-09 HISTORY — PX: KNEE SURGERY: SHX244

## 2014-11-13 ENCOUNTER — Other Ambulatory Visit: Payer: Self-pay | Admitting: Gastroenterology

## 2014-11-13 DIAGNOSIS — R1310 Dysphagia, unspecified: Secondary | ICD-10-CM

## 2014-11-20 ENCOUNTER — Ambulatory Visit
Admission: RE | Admit: 2014-11-20 | Discharge: 2014-11-20 | Disposition: A | Discharge status: Home / Self Care | Payer: Medicare Other | Source: Ambulatory Visit | Attending: Gastroenterology | Admitting: Gastroenterology

## 2014-11-20 DIAGNOSIS — R1310 Dysphagia, unspecified: Secondary | ICD-10-CM

## 2014-11-21 ENCOUNTER — Other Ambulatory Visit: Payer: Self-pay | Admitting: Gastroenterology

## 2014-11-21 DIAGNOSIS — R933 Abnormal findings on diagnostic imaging of other parts of digestive tract: Secondary | ICD-10-CM

## 2014-11-29 ENCOUNTER — Ambulatory Visit
Admission: RE | Admit: 2014-11-29 | Discharge: 2014-11-29 | Disposition: A | Discharge status: Home / Self Care | Payer: Medicare Other | Source: Ambulatory Visit | Attending: Gastroenterology | Admitting: Gastroenterology

## 2014-11-29 DIAGNOSIS — R933 Abnormal findings on diagnostic imaging of other parts of digestive tract: Secondary | ICD-10-CM

## 2014-11-29 MED ORDER — IOHEXOL 300 MG/ML  SOLN
75.0000 mL | Freq: Once | INTRAMUSCULAR | Status: AC | PRN
  Administered 2014-11-29: 75 mL via INTRAVENOUS

## 2014-12-04 ENCOUNTER — Other Ambulatory Visit: Payer: Self-pay | Admitting: Gastroenterology

## 2015-12-09 ENCOUNTER — Other Ambulatory Visit: Payer: Self-pay | Admitting: Family Medicine

## 2015-12-09 DIAGNOSIS — N644 Mastodynia: Secondary | ICD-10-CM

## 2015-12-11 ENCOUNTER — Ambulatory Visit
Admission: RE | Admit: 2015-12-11 | Discharge: 2015-12-11 | Disposition: A | Discharge status: Home / Self Care | Payer: Medicare Other | Source: Ambulatory Visit | Attending: Family Medicine | Admitting: Family Medicine

## 2015-12-11 DIAGNOSIS — N644 Mastodynia: Secondary | ICD-10-CM

## 2016-01-30 ENCOUNTER — Other Ambulatory Visit: Payer: Self-pay

## 2016-01-30 DIAGNOSIS — Z1231 Encounter for screening mammogram for malignant neoplasm of breast: Secondary | ICD-10-CM

## 2016-03-24 ENCOUNTER — Other Ambulatory Visit: Payer: Self-pay | Admitting: Obstetrics and Gynecology

## 2016-03-25 LAB — CYTOLOGY - PAP

## 2016-03-26 ENCOUNTER — Ambulatory Visit
Admission: RE | Admit: 2016-03-26 | Discharge: 2016-03-26 | Disposition: A | Discharge status: Home / Self Care | Payer: Medicare Other | Source: Ambulatory Visit

## 2016-03-26 DIAGNOSIS — Z1231 Encounter for screening mammogram for malignant neoplasm of breast: Secondary | ICD-10-CM

## 2016-04-02 ENCOUNTER — Other Ambulatory Visit: Payer: Self-pay | Admitting: Specialist

## 2016-04-14 ENCOUNTER — Encounter: Payer: Self-pay | Admitting: Neurology

## 2016-04-14 ENCOUNTER — Ambulatory Visit (INDEPENDENT_AMBULATORY_CARE_PROVIDER_SITE_OTHER): Payer: Medicare Other | Admitting: Neurology

## 2016-04-14 ENCOUNTER — Other Ambulatory Visit: Payer: Self-pay | Admitting: Neurology

## 2016-04-14 VITALS — BP 139/80 | HR 72 | Ht 63.0 in | Wt 252.0 lb

## 2016-04-14 DIAGNOSIS — G250 Essential tremor: Secondary | ICD-10-CM | POA: Diagnosis not present

## 2016-04-14 DIAGNOSIS — R413 Other amnesia: Secondary | ICD-10-CM

## 2016-04-14 DIAGNOSIS — R251 Tremor, unspecified: Secondary | ICD-10-CM

## 2016-04-14 DIAGNOSIS — R4789 Other speech disturbances: Secondary | ICD-10-CM | POA: Diagnosis not present

## 2016-04-14 MED ORDER — PRIMIDONE 50 MG PO TABS: 100.0000 mg | ORAL_TABLET | Freq: Every day | ORAL | Status: DC

## 2016-04-14 NOTE — Progress Notes (Signed)
[facility]    Provider:  Dr Jaynee Eagles Referring Provider: Dr. Olen Pel Primary Care Physician:  Orpah Melter, MD  CC:  tremor  HPI:  Heidi Byrd is a lovely 70 y.o. female here as a referral from Dr. Maceo Pro for tremors. She has a past medical history of hypertension, remote history of seizures, depression, anxiety, migraines, sleep apnea, prediabetes, obesity.  Mother had tremors too. Paternal grandparents had parkinson's disease maybe but unclear source of tremor. Mother had shaking, lorazepam helped a little. Patient's temor started 2-3 years. She notices it happens when she is performing action like holding a cup or trying to write, forget underlining. Her head shakes. Her legs also shake. Brother without tremor. No walking impairment, no other associated symptoms, no balance problems, no falls, not acting out dreams. Mother had essentia tremor. No inciting event to the tremor, no head trauma. She can't carry her cup of tea. Metoprolol did not help.  Slowly progressive. Having a hard time getting words out occasionally as well and memory loss. Tremor is significantly interfering with her life and actions.  Reviewed notes, labs and imaging from outside physicians, which showed:  His metabolic panel was unremarkable November 2016 except for elevated AST of 54, glucose 122 and sodium of 135, creatinine was normal at 0.67, TSH was normal in September 2016 1.96, vitamin D 0H was within normal limits 35.8, EDC with differential is normal.   Reviewed primary care notes, significant information includes the she was diagnosed with essential tremor, she was given metoprolol for primary hypertension which also is a medication used to treat tremor. She was referred to neurology for her tremor.  Personally reviewed EKG tracing. QTc 399. Normal sinus rhythm, normal EKG.    Review of Systems: Patient complains of symptoms per HPI as well as the following symptoms: Fatigue, birthmarks,  tremor, depression, anxiety, not enough sleep, decreased energy, disinterest in activities, sleepiness. Pertinent negatives per HPI. All others negative.   Social History   Social History  . Marital Status: Divorced    Spouse Name: N/A  . Number of Children: 2  . Years of Education: 14   Occupational History  . Retired    Social History Main Topics  . Smoking status: Never Smoker   . Smokeless tobacco: Never Used  . Alcohol Use: No  . Drug Use: No  . Sexual Activity: Not on file   Other Topics Concern  . Not on file   Social History Narrative   Mom lives with her and she is the caregiver.    Caffeine use: Rare-soda   Rare-tea    Family History  Problem Relation Age of Onset  . Diabetes Mother   . Hypertension Mother   . Osteoporosis Mother   . Bone cancer Maternal Grandfather     Past Medical History  Diagnosis Date  . Hypertension   . GERD (gastroesophageal reflux disease)   . Seasonal allergic reaction   . Kidney stones   . Hyperglycemia   . Depression     Past Surgical History  Procedure Laterality Date  . Scr colon    . Tonsillectomy and adenoidectomy    . Incontinence surgery    . Tubal ligation    . Carpal tunnel release      Bilateral  . Trigger finger release      x 4    Current Outpatient Prescriptions  Medication Sig Dispense Refill  . losartan (COZAAR) 25 MG tablet Take 25 mg by mouth daily.  0  .  metoprolol succinate (TOPROL-XL) 50 MG 24 hr tablet Take 50 mg by mouth daily.  2  . omeprazole (PRILOSEC) 40 MG capsule Take 40 mg by mouth daily.  3  . PARoxetine (PAXIL) 20 MG tablet Take 20 mg by mouth daily.  0  . primidone (MYSOLINE) 50 MG tablet Take 2 tablets (100 mg total) by mouth at bedtime. Start with 0.5 tablet at night then increase by 0.5 tablet every week. 60 tablet 11   No current facility-administered medications for this visit.    Allergies as of 04/14/2016 - Review Complete 04/14/2016  Allergen Reaction Noted  .  Tetracyclines & related Shortness Of Breath 06/14/2014  . Augmentin [amoxicillin-pot clavulanate] Nausea And Vomiting 06/14/2014  . Calcium channel blockers Other (See Comments) 06/14/2014  . Codeine Nausea Only 06/14/2014  . Hydrocodone-acetaminophen  09/14/2013    Vitals: BP 139/80 mmHg  Pulse 72  Ht 5\' 3"  (1.6 m)  Wt 252 lb (114.306 kg)  BMI 44.65 kg/m2 Last Weight:  Wt Readings from Last 1 Encounters:  04/14/16 252 lb (114.306 kg)   Last Height:   Ht Readings from Last 1 Encounters:  04/14/16 5\' 3"  (1.6 m)   Physical exam: Exam: Gen: NAD, conversant, well nourised, obese, well groomed                     CV: RRR, no MRG. No Carotid Bruits. No peripheral edema, warm, nontender Eyes: Conjunctivae clear without exudates or hemorrhage  Neuro: Detailed Neurologic Exam  Speech:    Speech is normal; fluent and spontaneous with normal comprehension.  Cognition:    The patient is oriented to person, place, and time;     recent and remote memory intact;     language fluent;     normal attention, concentration,     fund of knowledge Cranial Nerves:    The pupils are equal, round, and reactive to light. Attempted funduscopic exam could not visualize. Visual fields are full to finger confrontation. Extraocular movements are intact. Trigeminal sensation is intact and the muscles of mastication are normal. The face is symmetric. The palate elevates in the midline. Hearing intact. Voice is normal. Shoulder shrug is normal. The tongue has normal motion without fasciculations.   Coordination:    Normal finger to nose and heel to shin.   Gait:    Wide based due to body habitus  Motor Observation:  Patient was noted to have a head tremor as well as a high frequency low amplitude positional tremor increased with intention.    No asymmetry, no atrophy. Tone:    Normal muscle tone.    Posture:    Posture is normal. normal erect    Strength:    Strength is V/V in the upper and  lower limbs.      Sensation: intact to LT     Reflex Exam:  DTR's: Mild proximal leg weakness otherwise deep tendon reflexes in the upper and lower extremities are normal bilaterally.   Toes:    The toes are downgoing bilaterally.   Clonus:    Clonus is absent.       Assessment/Plan:  A very lovely 70 year old female with essential tremor. She is on metoprolol and is not helping. Will start primidone.  Memory loss, word finding difficulty and tremor: MRI of the brain. In next appointment can discuss this further, the patient describes word finding difficulty and memory changes, next appointment need to do memory testing and discuss further. In the meantime can  perform an MRI of the brain given these findings as well as tremor.  As far as your medications are concerned, I would like to suggest: Week one: 1/2 tablet at bedtime Week two: 1 tablet at bedtime Week three: 1.5 tablets at bedtime Week four: 2 tablets at bedtime  As far as diagnostic testing: Lab, MRI of the brain  I would like to see you back in 4 months, sooner if we need to. Please call us with any interim questions, concerns, problems, updates or refill requests. Email me in 6 weeks and let me know how you are doing, we can increase the primidone.  Did have long discussion about primidone with patient and significant side effects. Serious reactions can include thrombocytopenia and anemia, shortness of breath, withdrawal seizures, common reactions including ataxia, vertigo, nausea, vomiting, fatigue, nystagmus, drowsiness. Discussed that this medication can cause significant somnolence and risk for falls, patient should be very careful and start slowly and stop for any side effects at all  Cc: Dr. Staci Acosta, [facility] [address] [place], [place] [address]  Phone [phone] Fax [phone]

## 2016-04-14 NOTE — Patient Instructions (Signed)
Remember to drink plenty of fluid, eat healthy meals and do not skip any meals. Try to eat protein with a every meal and eat a healthy snack such as fruit or nuts in between meals. Try to keep a regular sleep-wake schedule and try to exercise daily, particularly in the form of walking, 20-30 minutes a day, if you can.   As far as your medications are concerned, I would like to suggest: Week one: 1/2 tablet at bedtime Week two: 1 tablet at bedtime Week three: 1.5 tablets at bedtime Week four: 2 tablets at bedtime  As far as diagnostic testing: Lab, MRI of the brain  I would like to see you back in 4 months, sooner if we need to. Please call us with any interim questions, concerns, problems, updates or refill requests. Email me in 6 weeks and let me know how you are doing, we can increase the primidone.  Our phone number is [phone]. We also have an after hours call service for urgent matters and there is a physician on-call for urgent questions. For any emergencies you know to call 911 or go to the nearest emergency room

## 2016-04-15 DIAGNOSIS — G250 Essential tremor: Secondary | ICD-10-CM

## 2016-04-15 HISTORY — DX: Essential tremor: G25.0

## 2016-04-15 LAB — THYROID PANEL WITH TSH
FREE THYROXINE INDEX: 1.7 (ref 1.2–4.9)
T3 UPTAKE RATIO: 18 % — AB (ref 24–39)
T4, Total: 9.5 ug/dL (ref 4.5–12.0)
TSH: 3.61 u[IU]/mL (ref 0.450–4.500)

## 2016-04-20 ENCOUNTER — Telehealth: Payer: Self-pay | Admitting: *Deleted

## 2016-04-20 NOTE — Telephone Encounter (Signed)
I have spoken with Heidi Byrd this morning, and per RAS, advised that labs done in our office look ok.  She verbalized understanding of same/fim

## 2016-04-20 NOTE — Telephone Encounter (Signed)
-----   Message from Melvenia Beam, MD sent at 04/18/2016  8:43 AM EDT ----- Labs unremarkable thanks

## 2016-04-26 ENCOUNTER — Ambulatory Visit
Admission: RE | Admit: 2016-04-26 | Discharge: 2016-04-26 | Disposition: A | Discharge status: Home / Self Care | Payer: Medicare Other | Source: Ambulatory Visit | Attending: Neurology | Admitting: Neurology

## 2016-04-26 DIAGNOSIS — R251 Tremor, unspecified: Secondary | ICD-10-CM

## 2016-04-26 DIAGNOSIS — R4789 Other speech disturbances: Secondary | ICD-10-CM

## 2016-04-26 DIAGNOSIS — R413 Other amnesia: Secondary | ICD-10-CM | POA: Diagnosis not present

## 2016-04-29 ENCOUNTER — Telehealth: Payer: Self-pay

## 2016-04-29 NOTE — Telephone Encounter (Signed)
-----   Message from Melvenia Beam, MD sent at 04/28/2016  6:02 PM EDT ----- MRI of the brain is normal for age. (for documentation: the empty sella is likely clinically not significant, she has no symptoms and this can be an incidental finding thanks)

## 2016-04-29 NOTE — Telephone Encounter (Signed)
I spoke to patient and she is aware of results.

## 2016-06-09 ENCOUNTER — Telehealth: Payer: Self-pay | Admitting: Neurology

## 2016-06-09 NOTE — Telephone Encounter (Signed)
Dr Ahern- please advise 

## 2016-06-09 NOTE — Telephone Encounter (Addendum)
Dr Jaynee Eagles- please advise Called pt. She stated that she believes the primidone is causing concentration problems/weird dreams/confusion. She states she did not have these sx prior to starting medication. She states she is walking in circles, she goes to pick up the phone to make a call, but does not know how to call. She tries to use the TV remote as the phone sometimes. She started the primidone right after OV with Dr Jaynee Eagles on 04/14/16. She takes 2 tablets at bedtime as prescribed. She states her mother has the same tremor and took lorazepam. She would like to be weaned off of the primidone and take lorazepam if Dr Jaynee Eagles approves. Advised Dr Jaynee Eagles is still seeing patient's for the day. I will send message to her and call back to advise by tomorrow. She verbalized understanding. She said she is leaving the house around 10am tomorrow and will not be back until 3pm and to call after 3pm.

## 2016-06-09 NOTE — Telephone Encounter (Signed)
Patient is calling. She needs to discuss medication primidone (MYSOLINE) 50 MG tablet. She states she cannot concentrate,  has weird dreams and her shaking is no better. Please call and discuss.

## 2016-06-10 ENCOUNTER — Other Ambulatory Visit: Payer: Self-pay | Admitting: Neurology

## 2016-06-10 NOTE — Addendum Note (Signed)
Addended by: Hope Pigeon on: 06/10/2016 04:24 PM   Modules accepted: Orders

## 2016-06-10 NOTE — Telephone Encounter (Signed)
Dr Jaynee Eagles- please advise Called pt back. Relayed Dr Jaynee Eagles message. Pt verbalized understanding. She is okay with trying the neurontin. She is wondering how she should wean off of the primidone, to then start the neurontin. Advised I will ask Dr Jaynee Eagles and call her back to advise. She verbalized understanding.   Pt stated she has new sx of shooting sensations in finger/other areas. Advised she should f/u with PCP about this. She verbalized understanding.

## 2016-06-10 NOTE — Telephone Encounter (Signed)
Heidi Byrd, please call patient back. Clonazepam is a benzodiazepine. This class of medication can have significant side effects including sedation, memory changes, risk for falls, withdrawal and dependence especially as we age. I would prefer to different medications possibly neurontin which also has side effects unfortunately, common such are dizziness, drowsiness, weakness, tired feeling, nausea, diarrhea, constipation, blurred vision, headache, breast swelling, dry mouth, loss of balance or coordination. We can try a low dose and then slowly increase. Let me know thanks

## 2016-06-10 NOTE — Telephone Encounter (Signed)
Dr Jaynee Eagles- can you send rx gabapentin to pt pharmacy?  I called her pharmacy and spoke to [place] and cx primidone rx.   Thank you!  Called and spoke to pt. Relayed Dr Jaynee Eagles message. She wrote down instructions and I had pt repeat instructions back to me to make sure she understood correctly. I verified pharmacy with pt. Advised Dr Jaynee Eagles will send new rx to her pharmacy.

## 2016-06-10 NOTE — Telephone Encounter (Signed)
She is on 2 pills of promidone at night. She can stop one pill for 2 weeks then stop the second pill. Start Neurontin 300mg  three times a day. Start with one pill at night for 2 days then increase to 2 pills daily for 2 days then increase to 3 pills daily. I would not start the neurontin until she is weaned off the primidone. thanks

## 2016-06-22 ENCOUNTER — Other Ambulatory Visit: Payer: Self-pay | Admitting: Neurology

## 2016-06-22 DIAGNOSIS — G250 Essential tremor: Secondary | ICD-10-CM

## 2016-06-22 MED ORDER — GABAPENTIN 300 MG PO CAPS: ORAL_CAPSULE | ORAL | 11 refills | Status: DC

## 2016-06-22 NOTE — Telephone Encounter (Signed)
Called it in; thanks

## 2016-06-22 NOTE — Telephone Encounter (Signed)
Faxed printed rx lyrica to pt pharmacy. Fax: [phone]. Received confirmation.

## 2016-06-22 NOTE — Telephone Encounter (Signed)
Dr Jaynee Eagles- can you send in rx gabapentin? I sent this previously. Thank you

## 2016-06-22 NOTE — Telephone Encounter (Signed)
Patient called to advise, Neurontin Rx still has not been sent to [facility].

## 2016-06-29 NOTE — Telephone Encounter (Signed)
Patient says she just saw Dr. [facility] in [facility] and Dr. Truman Hayward wants to know if it's okay to take 600 MG of Ibuprofen every 8 hours with Gabapentin.

## 2016-06-29 NOTE — Telephone Encounter (Signed)
Patient called regarding gabapentin (NEURONTIN) 300 MG capsule, states nurse said to take (1) at night x 2 nights, (2) at night x 2 nights, (3) at night from then on, states Rx is a little different, it says (1) at bedtime for 3 days, (1) capsule twice daily for 3 days, (1) capsule three times a day, needs to know if she is to take all 3 at bedtime or 3 times a day (morning, afternoon and night?), please call to advise.

## 2016-06-29 NOTE — Telephone Encounter (Signed)
Dr Jaynee Eagles- FYI  Called and spoke to pt. Advised the directions I had her follow were correct. She stated she is taking 3 tabs per day. Morning, afternoon, night. She should call if she has any SE, new or worsening sx. She verbalized understanding.   Stated per Dr Jaynee Eagles she can take 600mg  ibuprofen with gabapentin. She verbalized understanding.

## 2016-07-27 NOTE — Telephone Encounter (Signed)
Pt called said she is having side effects to gabapentin (NEURONTIN) 300 MG capsule . She said her voice is starting to tremor and a little bit of stuttering. She said there has been a change in mood-thoughts of "just wish I would die"- said is not suisidal. She is very tired also, wants to sleep all the time. She is wanting to stop the med. sts it has not helped the tremor. Please call

## 2016-07-27 NOTE — Telephone Encounter (Signed)
Dr Ahern- please advise 

## 2016-07-27 NOTE — Telephone Encounter (Signed)
Tell her to stop neurontin. She has a follow up in a few weeks and we can discuss next steps then thanks

## 2016-07-28 NOTE — Telephone Encounter (Signed)
Called and spoke to pt. Relayed per Dr Jaynee Eagles that she can stop neurontin and she will discuss next steps with her at her follow up. She verbalized understanding.

## 2016-08-03 ENCOUNTER — Telehealth: Payer: Self-pay | Admitting: Neurology

## 2016-08-03 NOTE — Telephone Encounter (Signed)
Noted, thank you. Pt has no upcoming appt scheduled.

## 2016-08-03 NOTE — Telephone Encounter (Addendum)
CALLED PT TO R/S 08/18/16 APPT PT STATES SHE WANTS IT CANCELED BUT NOT R/S PT FEELS SHE DOES NOT WANT TO TRY ANY OTHER MEDS.  ADDITIONAL COMMENTS FROM PT AT TIME OF CALL: "DR AHERN TOLD ME TO STOP GABAPENTIN COLD [place] AND I FELT HORRIBLE SO I AM WEANING MYSELF OFF OF IT AND DO NOT PLAN ON TRYING ANYTHING ELSE-NO MORE MEDS. PLEASE CANCEL MY APPT AND DO NOT RESCHED."

## 2016-08-03 NOTE — Telephone Encounter (Signed)
That's fine thanls

## 2016-08-18 ENCOUNTER — Ambulatory Visit: Payer: Medicare Other | Admitting: Neurology

## 2018-01-13 DIAGNOSIS — R413 Other amnesia: Secondary | ICD-10-CM | POA: Diagnosis not present

## 2018-01-13 DIAGNOSIS — I10 Essential (primary) hypertension: Secondary | ICD-10-CM | POA: Diagnosis not present

## 2018-01-13 DIAGNOSIS — K219 Gastro-esophageal reflux disease without esophagitis: Secondary | ICD-10-CM | POA: Diagnosis not present

## 2018-01-13 DIAGNOSIS — F321 Major depressive disorder, single episode, moderate: Secondary | ICD-10-CM | POA: Diagnosis not present

## 2018-03-15 DIAGNOSIS — B370 Candidal stomatitis: Secondary | ICD-10-CM | POA: Diagnosis not present

## 2018-04-11 DIAGNOSIS — H109 Unspecified conjunctivitis: Secondary | ICD-10-CM | POA: Diagnosis not present

## 2018-04-11 DIAGNOSIS — D72829 Elevated white blood cell count, unspecified: Secondary | ICD-10-CM | POA: Diagnosis not present

## 2018-04-11 DIAGNOSIS — E611 Iron deficiency: Secondary | ICD-10-CM | POA: Diagnosis not present

## 2018-04-11 DIAGNOSIS — D509 Iron deficiency anemia, unspecified: Secondary | ICD-10-CM | POA: Diagnosis not present

## 2018-04-11 DIAGNOSIS — E559 Vitamin D deficiency, unspecified: Secondary | ICD-10-CM | POA: Diagnosis not present

## 2018-04-11 DIAGNOSIS — K130 Diseases of lips: Secondary | ICD-10-CM | POA: Diagnosis not present

## 2018-04-14 DIAGNOSIS — D3132 Benign neoplasm of left choroid: Secondary | ICD-10-CM | POA: Diagnosis not present

## 2018-04-14 DIAGNOSIS — H26491 Other secondary cataract, right eye: Secondary | ICD-10-CM | POA: Diagnosis not present

## 2018-04-14 DIAGNOSIS — H43813 Vitreous degeneration, bilateral: Secondary | ICD-10-CM | POA: Diagnosis not present

## 2018-04-14 DIAGNOSIS — H524 Presbyopia: Secondary | ICD-10-CM | POA: Diagnosis not present

## 2018-05-05 DIAGNOSIS — H26491 Other secondary cataract, right eye: Secondary | ICD-10-CM | POA: Diagnosis not present

## 2018-05-18 ENCOUNTER — Other Ambulatory Visit (HOSPITAL_BASED_OUTPATIENT_CLINIC_OR_DEPARTMENT_OTHER): Payer: Self-pay | Admitting: Gastroenterology

## 2018-05-18 DIAGNOSIS — K219 Gastro-esophageal reflux disease without esophagitis: Secondary | ICD-10-CM | POA: Diagnosis not present

## 2018-05-18 DIAGNOSIS — R1010 Upper abdominal pain, unspecified: Secondary | ICD-10-CM | POA: Diagnosis not present

## 2018-05-18 DIAGNOSIS — K5792 Diverticulitis of intestine, part unspecified, without perforation or abscess without bleeding: Secondary | ICD-10-CM | POA: Diagnosis not present

## 2018-05-19 ENCOUNTER — Ambulatory Visit (HOSPITAL_BASED_OUTPATIENT_CLINIC_OR_DEPARTMENT_OTHER)
Admission: RE | Admit: 2018-05-19 | Discharge: 2018-05-19 | Disposition: A | Discharge status: Home / Self Care | Payer: PPO | Source: Ambulatory Visit | Attending: Gastroenterology | Admitting: Gastroenterology

## 2018-05-19 DIAGNOSIS — K838 Other specified diseases of biliary tract: Secondary | ICD-10-CM | POA: Diagnosis not present

## 2018-05-19 DIAGNOSIS — R1010 Upper abdominal pain, unspecified: Secondary | ICD-10-CM | POA: Insufficient documentation

## 2018-05-19 DIAGNOSIS — K760 Fatty (change of) liver, not elsewhere classified: Secondary | ICD-10-CM | POA: Diagnosis not present

## 2018-05-23 DIAGNOSIS — Z1231 Encounter for screening mammogram for malignant neoplasm of breast: Secondary | ICD-10-CM | POA: Diagnosis not present

## 2018-05-23 DIAGNOSIS — Z6841 Body Mass Index (BMI) 40.0 and over, adult: Secondary | ICD-10-CM | POA: Diagnosis not present

## 2018-05-23 DIAGNOSIS — Z01419 Encounter for gynecological examination (general) (routine) without abnormal findings: Secondary | ICD-10-CM | POA: Diagnosis not present

## 2018-05-30 DIAGNOSIS — J020 Streptococcal pharyngitis: Secondary | ICD-10-CM | POA: Diagnosis not present

## 2018-05-30 DIAGNOSIS — J029 Acute pharyngitis, unspecified: Secondary | ICD-10-CM | POA: Diagnosis not present

## 2018-06-06 DIAGNOSIS — R1084 Generalized abdominal pain: Secondary | ICD-10-CM | POA: Diagnosis not present

## 2018-06-06 DIAGNOSIS — R110 Nausea: Secondary | ICD-10-CM | POA: Diagnosis not present

## 2018-06-06 DIAGNOSIS — K5900 Constipation, unspecified: Secondary | ICD-10-CM | POA: Diagnosis not present

## 2018-06-08 DIAGNOSIS — K8020 Calculus of gallbladder without cholecystitis without obstruction: Secondary | ICD-10-CM | POA: Diagnosis not present

## 2018-06-08 DIAGNOSIS — K5900 Constipation, unspecified: Secondary | ICD-10-CM | POA: Diagnosis not present

## 2018-06-08 DIAGNOSIS — G43A0 Cyclical vomiting, not intractable: Secondary | ICD-10-CM | POA: Diagnosis not present

## 2018-06-08 DIAGNOSIS — K219 Gastro-esophageal reflux disease without esophagitis: Secondary | ICD-10-CM | POA: Diagnosis not present

## 2018-06-08 DIAGNOSIS — R1084 Generalized abdominal pain: Secondary | ICD-10-CM | POA: Diagnosis not present

## 2018-06-15 ENCOUNTER — Ambulatory Visit: Payer: Self-pay | Admitting: General Surgery

## 2018-06-15 DIAGNOSIS — K8044 Calculus of bile duct with chronic cholecystitis without obstruction: Secondary | ICD-10-CM | POA: Diagnosis not present

## 2018-06-20 ENCOUNTER — Encounter (HOSPITAL_COMMUNITY): Payer: Self-pay | Admitting: *Deleted

## 2018-06-23 DIAGNOSIS — K5900 Constipation, unspecified: Secondary | ICD-10-CM | POA: Diagnosis not present

## 2018-06-23 DIAGNOSIS — K146 Glossodynia: Secondary | ICD-10-CM | POA: Diagnosis not present

## 2018-06-23 DIAGNOSIS — K760 Fatty (change of) liver, not elsewhere classified: Secondary | ICD-10-CM | POA: Diagnosis not present

## 2018-06-23 DIAGNOSIS — K8020 Calculus of gallbladder without cholecystitis without obstruction: Secondary | ICD-10-CM | POA: Diagnosis not present

## 2018-06-23 DIAGNOSIS — R05 Cough: Secondary | ICD-10-CM | POA: Diagnosis not present

## 2018-06-23 DIAGNOSIS — K5792 Diverticulitis of intestine, part unspecified, without perforation or abscess without bleeding: Secondary | ICD-10-CM | POA: Diagnosis not present

## 2018-06-23 DIAGNOSIS — K219 Gastro-esophageal reflux disease without esophagitis: Secondary | ICD-10-CM | POA: Diagnosis not present

## 2018-06-23 DIAGNOSIS — R1010 Upper abdominal pain, unspecified: Secondary | ICD-10-CM | POA: Diagnosis not present

## 2018-06-23 NOTE — Patient Instructions (Addendum)
Asmara Backs  06/23/2018   Your procedure is scheduled on: 07-04-18  Report to [facility] Main  Entrance  Report to admitting at 1000 AM    Call this number if you have problems the morning of surgery [phone]   Remember: Do not eat food  :After Midnight.  NO SOLID FOOD AFTER MIDNIGHT THE NIGHT PRIOR TO SURGERY. NOTHING BY MOUTH EXCEPT CLEAR LIQUIDS UNTIL 3 HOURS PRIOR TO [place] SURGERY. PLEASE FINISH ENSURE DRINK PER SURGEON ORDER 3 HOURS PRIOR TO SCHEDULED SURGERY TIME WHICH NEEDS TO BE COMPLETED AT  900AM.   Take these medicines the morning of surgery with A SIP OF WATER: METOPROLOL SUCCINATE, OMEPRAZOLE                               You may not have any metal on your body including hair pins and              piercings  Do not wear jewelry, make-up, lotions, powders or perfumes, deodorant             Do not wear nail polish.  Do not shave  48 hours prior to surgery.                Do not bring valuables to the hospital. [place].  Contacts, dentures or bridgework may not be worn into surgery.  Leave suitcase in the car. After surgery it may be brought to your room.                  Please read over the following fact sheets you were given: _____________________________________________________________________   [facility] - Preparing for Surgery Before surgery, you can play an important role.  Because skin is not sterile, your skin needs to be as free of germs as possible.  You can reduce the number of germs on your skin by washing with CHG (chlorahexidine gluconate) soap before surgery.  CHG is an antiseptic cleaner which kills germs and bonds with the skin to continue killing germs even after washing. Please DO NOT use if you have an allergy to CHG or antibacterial soaps.  If your skin becomes reddened/irritated stop using the CHG and inform your nurse when you arrive at Short Stay. Do not shave  (including legs and underarms) for at least 48 hours prior to the first CHG shower.  You may shave your face/neck. Please follow these instructions carefully:  1.  Shower with CHG Soap the night before surgery and the  morning of Surgery.  2.  If you choose to wash your hair, wash your hair first as usual with your  normal  shampoo.  3.  After you shampoo, rinse your hair and body thoroughly to remove the  shampoo.                           4.  Use CHG as you would any other liquid soap.  You can apply chg directly  to the skin and wash                       Gently with a scrungie or clean washcloth.  5.  Apply the CHG  Soap to your body ONLY FROM THE NECK DOWN.   Do not use on face/ open                           Wound or open sores. Avoid contact with eyes, ears mouth and genitals (private parts).                       Wash face,  Genitals (private parts) with your normal soap.             6.  Wash thoroughly, paying special attention to the area where your surgery  will be performed.  7.  Thoroughly rinse your body with warm water from the neck down.  8.  DO NOT shower/wash with your normal soap after using and rinsing off  the CHG Soap.                9.  Pat yourself dry with a clean towel.            10.  Wear clean pajamas.            11.  Place clean sheets on your bed the night of your first shower and do not  sleep with pets. Day of Surgery : Do not apply any lotions/deodorants the morning of surgery.  Please wear clean clothes to the hospital/surgery center.  FAILURE TO FOLLOW THESE INSTRUCTIONS MAY RESULT IN THE CANCELLATION OF YOUR SURGERY PATIENT SIGNATURE_________________________________  NURSE SIGNATURE__________________________________  ________________________________________________________________________

## 2018-06-27 ENCOUNTER — Encounter (HOSPITAL_COMMUNITY): Payer: Self-pay

## 2018-06-27 ENCOUNTER — Other Ambulatory Visit: Payer: Self-pay

## 2018-06-27 ENCOUNTER — Encounter (HOSPITAL_COMMUNITY)
Admission: RE | Admit: 2018-06-27 | Discharge: 2018-06-27 | Disposition: A | Discharge status: Home / Self Care | Payer: PPO | Source: Ambulatory Visit | Attending: General Surgery | Admitting: General Surgery

## 2018-06-27 DIAGNOSIS — I10 Essential (primary) hypertension: Secondary | ICD-10-CM | POA: Diagnosis not present

## 2018-06-27 DIAGNOSIS — K8010 Calculus of gallbladder with chronic cholecystitis without obstruction: Secondary | ICD-10-CM | POA: Insufficient documentation

## 2018-06-27 DIAGNOSIS — Z01818 Encounter for other preprocedural examination: Secondary | ICD-10-CM | POA: Diagnosis not present

## 2018-06-27 DIAGNOSIS — Z01812 Encounter for preprocedural laboratory examination: Secondary | ICD-10-CM | POA: Diagnosis not present

## 2018-06-27 DIAGNOSIS — K7581 Nonalcoholic steatohepatitis (NASH): Secondary | ICD-10-CM | POA: Diagnosis not present

## 2018-06-27 HISTORY — DX: Dry mouth, unspecified: R68.2

## 2018-06-27 HISTORY — DX: Cough, unspecified: R05.9

## 2018-06-27 HISTORY — DX: Sleep apnea, unspecified: G47.30

## 2018-06-27 HISTORY — DX: Headache, unspecified: R51.9

## 2018-06-27 HISTORY — DX: Dry eye syndrome of bilateral lacrimal glands: H04.123

## 2018-06-27 HISTORY — DX: Cough: R05

## 2018-06-27 HISTORY — DX: Headache: R51

## 2018-06-27 HISTORY — DX: Fatty (change of) liver, not elsewhere classified: K76.0

## 2018-06-27 LAB — CBC
HCT: 46 % (ref 36.0–46.0)
Hemoglobin: 14.8 g/dL (ref 12.0–15.0)
MCH: 28.6 pg (ref 26.0–34.0)
MCHC: 32.2 g/dL (ref 30.0–36.0)
MCV: 88.8 fL (ref 78.0–100.0)
PLATELETS: 388 10*3/uL (ref 150–400)
RBC: 5.18 MIL/uL — ABNORMAL HIGH (ref 3.87–5.11)
RDW: 13.8 % (ref 11.5–15.5)
WBC: 10.5 10*3/uL (ref 4.0–10.5)

## 2018-06-27 LAB — COMPREHENSIVE METABOLIC PANEL
ALT: 45 U/L — ABNORMAL HIGH (ref 0–44)
AST: 78 U/L — AB (ref 15–41)
Albumin: 4 g/dL (ref 3.5–5.0)
Alkaline Phosphatase: 80 U/L (ref 38–126)
Anion gap: 9 (ref 5–15)
BILIRUBIN TOTAL: 1 mg/dL (ref 0.3–1.2)
BUN: 13 mg/dL (ref 8–23)
CO2: 30 mmol/L (ref 22–32)
Calcium: 9.8 mg/dL (ref 8.9–10.3)
Chloride: 102 mmol/L (ref 98–111)
Creatinine, Ser: 0.76 mg/dL (ref 0.44–1.00)
GFR calc Af Amer: >60 mL/min (ref >60)
Glucose, Bld: 122 mg/dL — ABNORMAL HIGH (ref 70–99)
POTASSIUM: 4.9 mmol/L (ref 3.5–5.1)
Sodium: 141 mmol/L (ref 135–145)
Total Protein: 8.6 g/dL — ABNORMAL HIGH (ref 6.5–8.1)

## 2018-06-27 LAB — NO BLOOD PRODUCTS

## 2018-06-27 NOTE — Progress Notes (Signed)
BLOOD REFUSAL FORM FAXED TO [facility] AND DR [facility]WOR[facility] OFFICE. FAX CONFIRMATIONS RECEIVED AND PLACED ON PATIENT CHART

## 2018-07-04 ENCOUNTER — Encounter (HOSPITAL_COMMUNITY): Payer: Self-pay | Admitting: *Deleted

## 2018-07-04 ENCOUNTER — Encounter (HOSPITAL_COMMUNITY)
Admission: RE | Disposition: A | Discharge status: Home / Self Care | Payer: Self-pay | Source: Ambulatory Visit | Attending: General Surgery

## 2018-07-04 ENCOUNTER — Ambulatory Visit (HOSPITAL_COMMUNITY): Payer: PPO | Admitting: Anesthesiology

## 2018-07-04 ENCOUNTER — Observation Stay (HOSPITAL_COMMUNITY)
Admission: RE | Admit: 2018-07-04 | Discharge: 2018-07-05 | Disposition: A | Discharge status: Home / Self Care | Payer: PPO | Source: Ambulatory Visit | Attending: General Surgery | Admitting: General Surgery

## 2018-07-04 ENCOUNTER — Ambulatory Visit (HOSPITAL_COMMUNITY): Payer: PPO

## 2018-07-04 DIAGNOSIS — I10 Essential (primary) hypertension: Secondary | ICD-10-CM | POA: Insufficient documentation

## 2018-07-04 DIAGNOSIS — R7989 Other specified abnormal findings of blood chemistry: Secondary | ICD-10-CM | POA: Diagnosis not present

## 2018-07-04 DIAGNOSIS — Z6841 Body Mass Index (BMI) 40.0 and over, adult: Secondary | ICD-10-CM | POA: Diagnosis not present

## 2018-07-04 DIAGNOSIS — K219 Gastro-esophageal reflux disease without esophagitis: Secondary | ICD-10-CM | POA: Insufficient documentation

## 2018-07-04 DIAGNOSIS — R7303 Prediabetes: Secondary | ICD-10-CM | POA: Diagnosis not present

## 2018-07-04 DIAGNOSIS — K8010 Calculus of gallbladder with chronic cholecystitis without obstruction: Secondary | ICD-10-CM

## 2018-07-04 DIAGNOSIS — Z9119 Patient's noncompliance with other medical treatment and regimen: Secondary | ICD-10-CM | POA: Diagnosis not present

## 2018-07-04 DIAGNOSIS — Z79899 Other long term (current) drug therapy: Secondary | ICD-10-CM | POA: Diagnosis not present

## 2018-07-04 DIAGNOSIS — Z888 Allergy status to other drugs, medicaments and biological substances status: Secondary | ICD-10-CM | POA: Diagnosis not present

## 2018-07-04 DIAGNOSIS — Z419 Encounter for procedure for purposes other than remedying health state, unspecified: Secondary | ICD-10-CM

## 2018-07-04 DIAGNOSIS — Z8371 Family history of colonic polyps: Secondary | ICD-10-CM | POA: Insufficient documentation

## 2018-07-04 DIAGNOSIS — Z885 Allergy status to narcotic agent status: Secondary | ICD-10-CM | POA: Insufficient documentation

## 2018-07-04 DIAGNOSIS — K589 Irritable bowel syndrome without diarrhea: Secondary | ICD-10-CM | POA: Diagnosis not present

## 2018-07-04 DIAGNOSIS — K760 Fatty (change of) liver, not elsewhere classified: Secondary | ICD-10-CM | POA: Insufficient documentation

## 2018-07-04 DIAGNOSIS — Z880 Allergy status to penicillin: Secondary | ICD-10-CM | POA: Diagnosis not present

## 2018-07-04 DIAGNOSIS — Z8249 Family history of ischemic heart disease and other diseases of the circulatory system: Secondary | ICD-10-CM | POA: Insufficient documentation

## 2018-07-04 DIAGNOSIS — G4730 Sleep apnea, unspecified: Secondary | ICD-10-CM | POA: Diagnosis not present

## 2018-07-04 DIAGNOSIS — G4733 Obstructive sleep apnea (adult) (pediatric): Secondary | ICD-10-CM | POA: Insufficient documentation

## 2018-07-04 DIAGNOSIS — E669 Obesity, unspecified: Secondary | ICD-10-CM | POA: Diagnosis not present

## 2018-07-04 HISTORY — DX: Calculus of gallbladder with chronic cholecystitis without obstruction: K80.10

## 2018-07-04 HISTORY — PX: CHOLECYSTECTOMY: SHX55

## 2018-07-04 LAB — CBC
HEMATOCRIT: 43.1 % (ref 36.0–46.0)
HEMOGLOBIN: 13.8 g/dL (ref 12.0–15.0)
MCH: 28.6 pg (ref 26.0–34.0)
MCHC: 32 g/dL (ref 30.0–36.0)
MCV: 89.2 fL (ref 78.0–100.0)
Platelets: 334 10*3/uL (ref 150–400)
RBC: 4.83 MIL/uL (ref 3.87–5.11)
RDW: 13.8 % (ref 11.5–15.5)
WBC: 10.9 10*3/uL — AB (ref 4.0–10.5)

## 2018-07-04 LAB — CREATININE, SERUM: CREATININE: 0.71 mg/dL (ref 0.44–1.00)

## 2018-07-04 SURGERY — LAPAROSCOPIC CHOLECYSTECTOMY WITH INTRAOPERATIVE CHOLANGIOGRAM
Anesthesia: General

## 2018-07-04 MED ORDER — 0.9 % SODIUM CHLORIDE (POUR BTL) OPTIME
TOPICAL | Status: DC | PRN
  Administered 2018-07-04: 1000 mL

## 2018-07-04 MED ORDER — ACETAMINOPHEN 650 MG RE SUPP: 650.0000 mg | Freq: Four times a day (QID) | RECTAL | Status: DC | PRN

## 2018-07-04 MED ORDER — ENOXAPARIN SODIUM 40 MG/0.4ML ~~LOC~~ SOLN
40.0000 mg | SUBCUTANEOUS | Status: DC
  Administered 2018-07-05: 40 mg via SUBCUTANEOUS
  Filled 2018-07-04: qty 0.4

## 2018-07-04 MED ORDER — PROPOFOL 10 MG/ML IV BOLUS
INTRAVENOUS | Status: AC
  Filled 2018-07-04: qty 20

## 2018-07-04 MED ORDER — METOPROLOL SUCCINATE ER 50 MG PO TB24
50.0000 mg | ORAL_TABLET | Freq: Every day | ORAL | Status: DC
  Administered 2018-07-05: 50 mg via ORAL
  Filled 2018-07-04: qty 1

## 2018-07-04 MED ORDER — EPHEDRINE 5 MG/ML INJ
INTRAVENOUS | Status: AC
  Filled 2018-07-04: qty 10

## 2018-07-04 MED ORDER — LACTATED RINGERS IV SOLN
INTRAVENOUS | Status: DC
  Administered 2018-07-04: 11:00:00 via INTRAVENOUS

## 2018-07-04 MED ORDER — ONDANSETRON HCL 4 MG/2ML IJ SOLN
INTRAMUSCULAR | Status: DC | PRN
  Administered 2018-07-04: 4 mg via INTRAVENOUS

## 2018-07-04 MED ORDER — POLYVINYL ALCOHOL 1.4 % OP SOLN
1.0000 [drp] | Freq: Four times a day (QID) | OPHTHALMIC | Status: DC | PRN
  Filled 2018-07-04: qty 15

## 2018-07-04 MED ORDER — SUGAMMADEX SODIUM 500 MG/5ML IV SOLN
INTRAVENOUS | Status: AC
  Filled 2018-07-04: qty 5

## 2018-07-04 MED ORDER — IOPAMIDOL (ISOVUE-300) INJECTION 61%
INTRAVENOUS | Status: AC
  Filled 2018-07-04: qty 50

## 2018-07-04 MED ORDER — MIDAZOLAM HCL 2 MG/2ML IJ SOLN
INTRAMUSCULAR | Status: AC
  Filled 2018-07-04: qty 2

## 2018-07-04 MED ORDER — MIDAZOLAM HCL 5 MG/5ML IJ SOLN
INTRAMUSCULAR | Status: DC | PRN
  Administered 2018-07-04: 2 mg via INTRAVENOUS

## 2018-07-04 MED ORDER — FENTANYL CITRATE (PF) 100 MCG/2ML IJ SOLN
INTRAMUSCULAR | Status: AC
  Filled 2018-07-04: qty 2

## 2018-07-04 MED ORDER — CIPROFLOXACIN IN D5W 400 MG/200ML IV SOLN
400.0000 mg | INTRAVENOUS | Status: AC
  Administered 2018-07-04: 400 mg via INTRAVENOUS
  Filled 2018-07-04: qty 200

## 2018-07-04 MED ORDER — IOPAMIDOL (ISOVUE-300) INJECTION 61%
INTRAVENOUS | Status: DC | PRN
  Administered 2018-07-04: 4 mL

## 2018-07-04 MED ORDER — LIDOCAINE 2% (20 MG/ML) 5 ML SYRINGE
INTRAMUSCULAR | Status: AC
  Filled 2018-07-04: qty 5

## 2018-07-04 MED ORDER — BUPIVACAINE-EPINEPHRINE (PF) 0.25% -1:200000 IJ SOLN
INTRAMUSCULAR | Status: AC
  Filled 2018-07-04: qty 30

## 2018-07-04 MED ORDER — CELECOXIB 200 MG PO CAPS
200.0000 mg | ORAL_CAPSULE | ORAL | Status: AC
  Administered 2018-07-04: 200 mg via ORAL
  Filled 2018-07-04: qty 1

## 2018-07-04 MED ORDER — TRAMADOL HCL 50 MG PO TABS: 50.0000 mg | ORAL_TABLET | Freq: Once | ORAL | Status: DC | PRN

## 2018-07-04 MED ORDER — BUPIVACAINE-EPINEPHRINE 0.25% -1:200000 IJ SOLN
INTRAMUSCULAR | Status: DC | PRN
  Administered 2018-07-04: 30 mL

## 2018-07-04 MED ORDER — PHENYLEPHRINE 40 MCG/ML (10ML) SYRINGE FOR IV PUSH (FOR BLOOD PRESSURE SUPPORT)
PREFILLED_SYRINGE | INTRAVENOUS | Status: AC
  Filled 2018-07-04: qty 10

## 2018-07-04 MED ORDER — CHLORHEXIDINE GLUCONATE CLOTH 2 % EX PADS: 6.0000 | MEDICATED_PAD | Freq: Once | CUTANEOUS | Status: DC

## 2018-07-04 MED ORDER — FENTANYL CITRATE (PF) 100 MCG/2ML IJ SOLN
INTRAMUSCULAR | Status: DC | PRN
  Administered 2018-07-04 (×5): 50 ug via INTRAVENOUS

## 2018-07-04 MED ORDER — ONDANSETRON HCL 4 MG/2ML IJ SOLN
4.0000 mg | Freq: Four times a day (QID) | INTRAMUSCULAR | Status: DC | PRN
  Administered 2018-07-04: 4 mg via INTRAVENOUS
  Filled 2018-07-04: qty 2

## 2018-07-04 MED ORDER — EPHEDRINE SULFATE 50 MG/ML IJ SOLN
INTRAMUSCULAR | Status: DC | PRN
  Administered 2018-07-04: 5 mg via INTRAVENOUS

## 2018-07-04 MED ORDER — LACTATED RINGERS IV SOLN
INTRAVENOUS | Status: DC
  Administered 2018-07-04: 16:00:00 via INTRAVENOUS

## 2018-07-04 MED ORDER — ONDANSETRON 4 MG PO TBDP: 4.0000 mg | ORAL_TABLET | Freq: Four times a day (QID) | ORAL | Status: DC | PRN

## 2018-07-04 MED ORDER — ONDANSETRON HCL 4 MG/2ML IJ SOLN: 4.0000 mg | Freq: Once | INTRAMUSCULAR | Status: DC | PRN

## 2018-07-04 MED ORDER — PROPOFOL 10 MG/ML IV BOLUS
INTRAVENOUS | Status: DC | PRN
  Administered 2018-07-04: 160 mg via INTRAVENOUS

## 2018-07-04 MED ORDER — ROCURONIUM BROMIDE 50 MG/5ML IV SOSY
PREFILLED_SYRINGE | INTRAVENOUS | Status: DC | PRN
  Administered 2018-07-04: 50 mg via INTRAVENOUS

## 2018-07-04 MED ORDER — PHENYLEPHRINE 40 MCG/ML (10ML) SYRINGE FOR IV PUSH (FOR BLOOD PRESSURE SUPPORT)
PREFILLED_SYRINGE | INTRAVENOUS | Status: DC | PRN
  Administered 2018-07-04 (×2): 80 ug via INTRAVENOUS

## 2018-07-04 MED ORDER — DEXAMETHASONE SODIUM PHOSPHATE 10 MG/ML IJ SOLN
INTRAMUSCULAR | Status: AC
  Filled 2018-07-04: qty 1

## 2018-07-04 MED ORDER — PANTOPRAZOLE SODIUM 40 MG PO TBEC
40.0000 mg | DELAYED_RELEASE_TABLET | Freq: Every day | ORAL | Status: DC
  Administered 2018-07-05: 40 mg via ORAL
  Filled 2018-07-04: qty 1

## 2018-07-04 MED ORDER — DEXAMETHASONE SODIUM PHOSPHATE 4 MG/ML IJ SOLN
INTRAMUSCULAR | Status: DC | PRN
  Administered 2018-07-04: 10 mg via INTRAVENOUS

## 2018-07-04 MED ORDER — LIDOCAINE 2% (20 MG/ML) 5 ML SYRINGE
INTRAMUSCULAR | Status: DC | PRN
  Administered 2018-07-04: 60 mg via INTRAVENOUS

## 2018-07-04 MED ORDER — SUGAMMADEX SODIUM 200 MG/2ML IV SOLN
INTRAVENOUS | Status: DC | PRN
  Administered 2018-07-04: 200 mg via INTRAVENOUS

## 2018-07-04 MED ORDER — LOSARTAN POTASSIUM 50 MG PO TABS
50.0000 mg | ORAL_TABLET | Freq: Every day | ORAL | Status: DC
Start: 2018-07-04 — End: 2018-07-05
  Administered 2018-07-04 – 2018-07-05 (×2): 50 mg via ORAL
  Filled 2018-07-04 (×2): qty 1

## 2018-07-04 MED ORDER — ACETAMINOPHEN 325 MG PO TABS
650.0000 mg | ORAL_TABLET | Freq: Four times a day (QID) | ORAL | Status: DC | PRN
  Administered 2018-07-04: 650 mg via ORAL
  Filled 2018-07-04: qty 2

## 2018-07-04 MED ORDER — TRAMADOL HCL 50 MG PO TABS
50.0000 mg | ORAL_TABLET | Freq: Four times a day (QID) | ORAL | Status: DC | PRN
  Administered 2018-07-04 – 2018-07-05 (×4): 50 mg via ORAL
  Filled 2018-07-04 (×4): qty 1

## 2018-07-04 MED ORDER — POLYETHYL GLYCOL-PROPYL GLYCOL 0.4-0.3 % OP SOLN: 1.0000 [drp] | Freq: Four times a day (QID) | OPHTHALMIC | Status: DC | PRN

## 2018-07-04 MED ORDER — LACTATED RINGERS IR SOLN
Status: DC | PRN
  Administered 2018-07-04: 1000 mL

## 2018-07-04 MED ORDER — ROCURONIUM BROMIDE 100 MG/10ML IV SOLN
INTRAVENOUS | Status: AC
  Filled 2018-07-04: qty 1

## 2018-07-04 MED ORDER — FENTANYL CITRATE (PF) 250 MCG/5ML IJ SOLN
INTRAMUSCULAR | Status: AC
  Filled 2018-07-04: qty 5

## 2018-07-04 MED ORDER — FENTANYL CITRATE (PF) 100 MCG/2ML IJ SOLN
25.0000 ug | INTRAMUSCULAR | Status: DC | PRN
  Administered 2018-07-04 (×2): 25 ug via INTRAVENOUS
  Administered 2018-07-04: 50 ug via INTRAVENOUS

## 2018-07-04 MED ORDER — GABAPENTIN 300 MG PO CAPS
300.0000 mg | ORAL_CAPSULE | ORAL | Status: AC
  Administered 2018-07-04: 300 mg via ORAL
  Filled 2018-07-04: qty 1

## 2018-07-04 MED ORDER — ONDANSETRON HCL 4 MG/2ML IJ SOLN
INTRAMUSCULAR | Status: AC
  Filled 2018-07-04: qty 2

## 2018-07-04 MED ORDER — LIDOCAINE 2% (20 MG/ML) 5 ML SYRINGE
INTRAMUSCULAR | Status: DC | PRN
  Administered 2018-07-04: 1.5 mg/kg/h via INTRAVENOUS

## 2018-07-04 SURGICAL SUPPLY — 40 items
ADH SKN CLS APL DERMABOND .7 (GAUZE/BANDAGES/DRESSINGS) ×1
APPLIER CLIP ROT 10 11.4 M/L (STAPLE) ×3
APR CLP MED LRG 11.4X10 (STAPLE) ×1
BAG SPEC RTRVL 10 TROC 200 (ENDOMECHANICALS) ×1
CABLE HIGH FREQUENCY MONO STRZ (ELECTRODE) ×3 IMPLANT
CATH REDDICK CHOLANGI 4FR 50CM (CATHETERS) ×2 IMPLANT
CHLORAPREP W/TINT 26ML (MISCELLANEOUS) ×3 IMPLANT
CLIP APPLIE ROT 10 11.4 M/L (STAPLE) ×1 IMPLANT
COVER MAYO STAND STRL (DRAPES) ×3 IMPLANT
COVER SURGICAL LIGHT HANDLE (MISCELLANEOUS) ×3 IMPLANT
DECANTER SPIKE VIAL GLASS SM (MISCELLANEOUS) ×3 IMPLANT
DERMABOND ADVANCED (GAUZE/BANDAGES/DRESSINGS) ×2
DERMABOND ADVANCED .7 DNX12 (GAUZE/BANDAGES/DRESSINGS) ×1 IMPLANT
DRAPE C-ARM 42X120 X-RAY (DRAPES) ×3 IMPLANT
ELECT REM PT RETURN 15FT ADLT (MISCELLANEOUS) ×3 IMPLANT
FILTER SMOKE EVAC LAPAROSHD (FILTER) ×3 IMPLANT
GLOVE BIOGEL PI IND STRL 7.0 (GLOVE) IMPLANT
GLOVE BIOGEL PI IND STRL 7.5 (GLOVE) ×1 IMPLANT
GLOVE BIOGEL PI INDICATOR 7.0 (GLOVE) ×6
GLOVE BIOGEL PI INDICATOR 7.5 (GLOVE) ×2
GLOVE ECLIPSE 7.5 STRL STRAW (GLOVE) ×3 IMPLANT
GLOVE SURG SIGNA 7.5 PF LTX (GLOVE) ×2 IMPLANT
GLOVE SURG SS PI 7.0 STRL IVOR (GLOVE) ×2 IMPLANT
GOWN STRL REUS W/TWL XL LVL3 (GOWN DISPOSABLE) ×11 IMPLANT
HEMOSTAT SNOW SURGICEL 2X4 (HEMOSTASIS) IMPLANT
HEMOSTAT SURGICEL 4X8 (HEMOSTASIS) IMPLANT
KIT BASIN OR (CUSTOM PROCEDURE TRAY) ×3 IMPLANT
POUCH RETRIEVAL ECOSAC 10 (ENDOMECHANICALS) IMPLANT
POUCH RETRIEVAL ECOSAC 10MM (ENDOMECHANICALS) ×2
SCISSORS LAP 5X35 DISP (ENDOMECHANICALS) ×3 IMPLANT
SET CHOLANGIOGRAPH MIX (MISCELLANEOUS) ×1 IMPLANT
SET IRRIG TUBING LAPAROSCOPIC (IRRIGATION / IRRIGATOR) ×3 IMPLANT
SLEEVE XCEL OPT CAN 5 100 (ENDOMECHANICALS) ×3 IMPLANT
SUT MNCRL AB 4-0 PS2 18 (SUTURE) ×3 IMPLANT
TOWEL OR 17X26 10 PK STRL BLUE (TOWEL DISPOSABLE) ×3 IMPLANT
TRAY LAPAROSCOPIC (CUSTOM PROCEDURE TRAY) ×3 IMPLANT
TROCAR BLADELESS OPT 5 100 (ENDOMECHANICALS) ×3 IMPLANT
TROCAR XCEL BLUNT TIP 100MML (ENDOMECHANICALS) ×3 IMPLANT
TROCAR XCEL NON-BLD 11X100MML (ENDOMECHANICALS) ×3 IMPLANT
TUBING INSUF HEATED (TUBING) ×1 IMPLANT

## 2018-07-04 NOTE — Interval H&P Note (Signed)
History and Physical Interval Note:  07/04/2018 11:54 AM  Heidi Byrd  has presented today for surgery, with the diagnosis of cholelithiasis and cholecystitis  The various methods of treatment have been discussed with the patient and family. After consideration of risks, benefits and other options for treatment, the patient has consented to  Procedure(s): LAPAROSCOPIC CHOLECYSTECTOMY WITH INTRAOPERATIVE CHOLANGIOGRAM (N/A) possible LIVER BIOPSY (N/A) as a surgical intervention .  The patient's history has been reviewed, patient examined, no change in status, stable for surgery.  I have reviewed the patient's chart and labs.  Questions were answered to the patient's satisfaction.     Darene Lamer Miquel Stacks

## 2018-07-04 NOTE — Anesthesia Procedure Notes (Signed)
Procedure Name: Intubation Date/Time: 07/04/2018 12:27 PM Performed by: Deliah Boston, CRNA Pre-anesthesia Checklist: Patient identified, Emergency Drugs available, Suction available and Patient being monitored Patient Re-evaluated:Patient Re-evaluated prior to induction Oxygen Delivery Method: Circle system utilized Preoxygenation: Pre-oxygenation with 100% oxygen Induction Type: IV induction Ventilation: Mask ventilation without difficulty Laryngoscope Size: Mac and 3 Tube type: Oral Tube size: 7.0 mm Number of attempts: 1 Airway Equipment and Method: Stylet and Oral airway Placement Confirmation: ETT inserted through vocal cords under direct vision,  positive ETCO2 and breath sounds checked- equal and bilateral Secured at: 22 cm Tube secured with: Tape Dental Injury: Injury to tongue  Difficulty Due To: Difficulty was anticipated and Difficult Airway- due to anterior larynx Comments: Small nick noted to midline tip of tongue, Dr. Fransisco Beau aware. Teeth and lips as preop

## 2018-07-04 NOTE — Anesthesia Preprocedure Evaluation (Addendum)
Anesthesia Evaluation  Patient identified by MRN, date of birth, ID band Patient awake    Reviewed: Allergy & Precautions, NPO status , Patient's Chart, lab work & pertinent test results  History of Anesthesia Complications Negative for: history of anesthetic complications  Airway Mallampati: III  TM Distance: >3 FB Neck ROM: Limited    Dental  (+) Dental Advisory Given, Teeth Intact, Missing   Pulmonary sleep apnea (noncompliant with CPAP) ,    breath sounds clear to auscultation       Cardiovascular hypertension, Pt. on medications and Pt. on home beta blockers  Rhythm:Regular Rate:Normal     Neuro/Psych neg Headaches, Depression    GI/Hepatic Neg liver ROS, GERD  Medicated and Controlled, Cholecystitis    Endo/Other  Morbid obesity Pre-DM   Renal/GU negative Renal ROS  negative genitourinary   Musculoskeletal negative musculoskeletal ROS (+)   Abdominal (+) + obese,   Peds  Hematology  (+) REFUSES BLOOD PRODUCTS, JEHOVAH'S WITNESS  Anesthesia Other Findings   Reproductive/Obstetrics                            Anesthesia Physical Anesthesia Plan  ASA: III  Anesthesia Plan: General   Post-op Pain Management:    Induction: Intravenous  PONV Risk Score and Plan: 4 or greater and Treatment may vary due to age or medical condition, Ondansetron, Dexamethasone and Midazolam  Airway Management Planned: Oral ETT  Additional Equipment: None  Intra-op Plan:   Post-operative Plan: Extubation in OR  Informed Consent: I have reviewed the patients History and Physical, chart, labs and discussed the procedure including the risks, benefits and alternatives for the proposed anesthesia with the patient or authorized representative who has indicated his/her understanding and acceptance.   Dental advisory given  Plan Discussed with: CRNA and Anesthesiologist  Anesthesia Plan Comments:         Anesthesia Quick Evaluation

## 2018-07-04 NOTE — Transfer of Care (Signed)
Immediate Anesthesia Transfer of Care Note  Patient: Heidi Byrd  Procedure(s) Performed: Procedure(s): LAPAROSCOPIC CHOLECYSTECTOMY WITH INTRAOPERATIVE CHOLANGIOGRAM (N/A)  Patient Location: PACU  Anesthesia Type:General  Level of Consciousness: Patient easily awoken, sedated, comfortable, cooperative, following commands, responds to stimulation.   Airway & Oxygen Therapy: Patient spontaneously breathing, ventilating well, oxygen via simple oxygen mask.  Post-op Assessment: Report given to PACU RN, vital signs reviewed and stable, moving all extremities.   Post vital signs: Reviewed and stable.  Complications: No apparent anesthesia complications  Last Vitals:  Vitals Value Taken Time  BP 145/68 07/04/2018  2:03 PM  Temp    Pulse 88 07/04/2018  2:05 PM  Resp 19 07/04/2018  2:05 PM  SpO2 100 % 07/04/2018  2:05 PM  Vitals shown include unvalidated device data.  Last Pain:  Vitals:   07/04/18 1105  TempSrc: Oral  PainSc:          Complications: No apparent anesthesia complications

## 2018-07-04 NOTE — Op Note (Signed)
Preoperative diagnosis: Cholelithiasis and cholecystitis  Postoperative diagnosis: Cholelithiasis and cholecystitis  Surgical procedure: Laparoscopic cholecystectomy with intraoperative cholangiogram  Surgeon: Marland Kitchen T. Makela Niehoff M.D.  Assistant: Alphonsa Overall  Anesthesia: General Endotracheal  Complications: None  Estimated blood loss: Minimal  Description of procedure: The patient brought to the operating room, placed in the supine position on the operating table, and general endotracheal anesthesia induced. The abdomen was widely sterilely prepped and draped. The patient had received preoperative IV antibiotics and PAS were in place. Patient timeout was performed the correct procedure verified. Standard 4 port technique was used with an open Hassan cannula at the umbilicus and the remainder of the ports placed under direct vision. The gallbladder was visualized. It had some moderate omental adhesions to the gallbladder but otherwise appeared normal.  The liver showed some mild to moderate fatty infiltration but no nodularity or evidence of cirrhosis. The fundus was grasped and elevated up over the liver and the infundibulum retracted inferiolaterally. Peritoneum anterior and posterior to close triangle was incised and fibrofatty tissue stripped off the neck of the gallbladder toward the porta hepatis. The distal gallbladder was thoroughly dissected. The cystic artery was identified in Calot's triangle and the cystic duct gallbladder junction dissected 360.  A good critical view was obtained. When the anatomy was clear the cystic duct was clipped at the gallbladder junction and an operative cholangiogram obtained through the cystic duct. This showed good filling of a normal common bile duct and intrahepatic ducts with free flow into the duodenum and no filling defects. Following this the cholangiocath was removed and the cystic duct was doubly clipped proximally and divided. The cystic artery was  doubly clipped proximally and distally and divided. The gallbladder was dissected free from its bed using hook cautery and removed through the umbilical port site. Complete hemostasis was obtained in the gallbladder bed. The right upper quadrant was thoroughly irrigated and hemostasis assured. Trochars were removed and all CO2 evacuated and the Inov8 Surgical trocar site fascial defect closed. Skin incisions were closed with subcuticular Monocryl and Dermabond. Sponge needle and instrument counts were correct. The patient was taken to PACU in good condition.  Darene Lamer Edder Bellanca  07/04/2018

## 2018-07-04 NOTE — H&P (Signed)
History of Present Illness Heidi Kitchen T. Heidi Brandenburg MD; 06/15/2018 12:04 PM) The patient is a 72 year old female who presents for evaluation of gall stones. Patient is a pleasant 72 year old female referred by Heidi Byrd and Dr. Herbie Baltimore Byrd and [facility] for abdominal pain and gallstones. She is followed therefore multiple GI issues including irritable bowel syndrome, reflux and history of diverticulitis. However her chief complaint particularly recently has been episodic right upper quadrant abdominal pain. She states she has had some degree of difficulty for a number of years and has had a negative workup in [place]. This has gradually gotten worse. She describes episodic sudden onset of severe pressure-like pain in her right upper quadrant radiating around to her back which is associated with nausea and vomiting. She tends to wait this out at home and after some hours it will go away. No fever or chills or jaundice. Recently a gallbladder ultrasound was obtained showing echogenic sludge and likely very small gallstones. Common bile duct normal. Also noted was hepatic steatosis or possibly hepatocellular disease. LFTs showed just a mildly elevated ALT. Between episodes she feels relatively well. Of note is she does have sleep apnea but is unable to tolerate a CPAP and does not use this. Other than this and obesity she does not have any major comorbidities.   Past Surgical History Heidi Byrd; 06/15/2018 11:23 AM) Cataract Surgery  Bilateral. Colon Polyp Removal - Colonoscopy  Knee Surgery  Right. Oral Surgery  Tonsillectomy   Diagnostic Studies History Heidi Byrd; 06/15/2018 11:23 AM) Colonoscopy  1-5 years ago Mammogram  within last year Pap Smear  1-5 years ago  Allergies Heidi Byrd; 06/15/2018 11:24 AM) Tetracycline HCl *DERMATOLOGICALS*  Allergies Reconciled   Medication History Heidi Byrd; 06/15/2018 11:27 AM) Losartan Potassium (25MG Tablet, Oral)  Active. Metoprolol Succinate ER (25MG Tablet ER 24HR, Oral) Active. Vitamin B Complex (Oral) Active. Stool Softener (100MG Tablet, Oral) Active. MiraLax (Oral) Active. Omeprazole (40MG Capsule DR, Oral) Active. Medications Reconciled  Social History Heidi Byrd; 06/15/2018 11:23 AM) Caffeine use  Tea. No alcohol use  No drug use  Tobacco use  Never smoker.  Family History Heidi Byrd; 06/15/2018 11:23 AM) Cancer  Father, Mother. Colon Polyps  Father, Mother. Diabetes Mellitus  Mother. Hypertension  Father, Mother. Migraine Headache  Daughter. Respiratory Condition  Father.  Pregnancy / Birth History Heidi Byrd; 06/15/2018 11:23 AM) Age at menarche  66 years. Age of menopause  <45 Gravida  2 Length (months) of breastfeeding  7-12 Maternal age  72-25 Para  2  Other Problems Heidi Byrd; 06/15/2018 11:23 AM) Anxiety Disorder  Arthritis  Back Pain  Cholelithiasis  Depression  Diverticulosis  Gastroesophageal Reflux Disease  Hemorrhoids  Hepatitis  High blood pressure  Migraine Headache  Sleep Apnea     Review of Systems Heidi Byrd; 06/15/2018 11:23 AM) General Present- Appetite Loss and Fatigue. Not Present- Chills, Fever, Night Sweats, Weight Gain and Weight Loss. Skin Not Present- Change in Wart/Mole, Dryness, Hives, Jaundice, New Lesions, Non-Healing Wounds, Rash and Ulcer. HEENT Present- Seasonal Allergies and Wears glasses/contact lenses. Not Present- Earache, Hearing Loss, Hoarseness, Nose Bleed, Oral Ulcers, Ringing in the Ears, Sinus Pain, Sore Throat, Visual Disturbances and Yellow Eyes. Respiratory Present- Snoring. Not Present- Bloody sputum, Chronic Cough, Difficulty Breathing and Wheezing. Breast Not Present- Breast Mass, Breast Pain, Nipple Discharge and Skin Changes. Cardiovascular Not Present- Chest Pain, Difficulty Breathing Lying Down, Leg Cramps, Palpitations, Rapid Heart Rate, Shortness of Breath and Swelling  of Extremities. Gastrointestinal Present-  Abdominal Pain, Bloating, Hemorrhoids, Indigestion and Nausea. Not Present- Bloody Stool, Change in Bowel Habits, Chronic diarrhea, Constipation, Difficulty Swallowing, Excessive gas, Gets full quickly at meals, Rectal Pain and Vomiting. Female Genitourinary Not Present- Frequency, Nocturia, Painful Urination, Pelvic Pain and Urgency. Musculoskeletal Not Present- Back Pain, Joint Pain, Joint Stiffness, Muscle Pain, Muscle Weakness and Swelling of Extremities. Neurological Not Present- Decreased Memory, Fainting, Headaches, Numbness, Seizures, Tingling, Tremor, Trouble walking and Weakness. Psychiatric Present- Anxiety and Depression. Not Present- Bipolar, Change in Sleep Pattern, Fearful and Frequent crying. Endocrine Not Present- Cold Intolerance, Excessive Hunger, Hair Changes, Heat Intolerance, Hot flashes and New Diabetes. Hematology Not Present- Blood Thinners, Easy Bruising, Excessive bleeding, Gland problems, HIV and Persistent Infections.  Vitals Heidi Byrd; 06/15/2018 11:28 AM) 06/15/2018 11:27 AM Weight: 241.5 lb Height: 63in Body Surface Area: 2.1 m Body Mass Index: 42.78 kg/m  Temp.: 98.26F(Oral)  Pulse: 94 (Regular)  BP: 130/82 (Sitting, Left Arm, Standard)       Physical Exam Heidi Kitchen T. Dondra Rhett MD; 06/15/2018 12:05 PM) The physical exam findings are as follows: Note:General: Alert, obese Caucasian female, in no distress Skin: Warm and dry without rash or infection. HEENT: No palpable masses or thyromegaly. Sclera nonicteric. Pupils equal round and reactive. Lymph nodes: No cervical, supraclavicular, or inguinal nodes palpable. Lungs: Breath sounds clear and equal. No wheezing or increased work of breathing. Cardiovascular: Regular rate and rhythm without murmer. No JVD or edema. Peripheral pulses intact. No carotid bruits. Abdomen: Nondistended. Mild to moderate epigastric and right upper quadrant tenderness. No  masses palpable. No organomegaly. No palpable hernias. Extremities: No edema or joint swelling or deformity. Multiple lower extremity spider veins Neurologic: Alert and fully oriented. Gait normal. No focal weakness. Mild to moderate tremor Psychiatric: Normal mood and affect. Thought content appropriate with normal judgement and insight    Assessment & Plan Heidi Kitchen T. 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 However more recently worsening episodic epigastric and right upper quadrant pain associated with nausea and vomiting that is classic for biliary tract disease. Ultrasound has shown sludge and probable small stones. I think she very likely is having biliary colic and chronic cholecystitis and would benefit from cholecystectomy in terms of relief of symptoms and prevention of future complications. I discussed the procedure in detail. The patient was given Neurosurgeon. We discussed the risks and benefits of a laparoscopic cholecystectomy and possible cholangiogram including, but not limited to, bleeding, infection, injury to surrounding structures such as the intestine or liver, bile leak, retained gallstones, need to convert to an open procedure, prolonged diarrhea, blood clots such as DVT, common bile duct injury, anesthesia risks, and possible need for additional procedures. The likelihood of improvement in symptoms and return to the patient's normal status is good. We discussed the typical post-operative recovery course. All questions were answered. Of note is she has obstructive sleep apnea and is unable to use a CPAP. For this reason I would recommend overnight observation postoperatively. 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 We also discussed possibly doing a liver biopsy should her liver appear markedly abnormal. Current Plans Postoperative cholecystectomy with intraoperative cholangiogram, possible liver biopsy, with overnight observation

## 2018-07-04 NOTE — Anesthesia Postprocedure Evaluation (Signed)
Anesthesia Post Note  Patient: Heidi Byrd  Procedure(s) Performed: LAPAROSCOPIC CHOLECYSTECTOMY WITH INTRAOPERATIVE CHOLANGIOGRAM (N/A )     Patient location during evaluation: PACU Anesthesia Type: General Level of consciousness: awake and alert Pain management: pain level controlled Vital Signs Assessment: post-procedure vital signs reviewed and stable Respiratory status: spontaneous breathing, nonlabored ventilation and respiratory function stable Cardiovascular status: blood pressure returned to baseline and stable Postop Assessment: no apparent nausea or vomiting Anesthetic complications: no    Last Vitals:  Vitals:   07/04/18 1415 07/04/18 1430  BP: (!) 156/78 (!) 159/147  Pulse: 83 78  Resp: 16 15  Temp:    SpO2: 99% 100%    Last Pain:  Vitals:   07/04/18 1430  TempSrc:   PainSc: [place] Ji [place]

## 2018-07-05 ENCOUNTER — Encounter (HOSPITAL_COMMUNITY): Payer: Self-pay | Admitting: General Surgery

## 2018-07-05 DIAGNOSIS — K8010 Calculus of gallbladder with chronic cholecystitis without obstruction: Secondary | ICD-10-CM | POA: Diagnosis not present

## 2018-07-05 MED ORDER — TRAMADOL HCL 50 MG PO TABS: 50.0000 mg | ORAL_TABLET | Freq: Four times a day (QID) | ORAL | 0 refills | Status: DC | PRN

## 2018-07-05 NOTE — Progress Notes (Signed)
Patient was given discharge instructions, all questions were answered. Patient was taken to main entrance by wheelchair.

## 2018-07-05 NOTE — Discharge Summary (Signed)
Patient ID: Heidi Byrd [id number] 71 y.o. [birth year]  07/04/2018  Discharge date and time: 07/05/2018   Admitting Physician: Edward Jolly  Discharge Physician: Edward Jolly  Admission Diagnoses: cholelithiasis and cholecystitis  Discharge Diagnoses: Same  Operations: Procedure(s): LAPAROSCOPIC CHOLECYSTECTOMY WITH INTRAOPERATIVE CHOLANGIOGRAM  Admission Condition: good  Discharged Condition: good  Indication for Admission: 72 year old female with obesity, apparent hepatic steatosis, and a number of GI complaints including irritable bowel, history of diverticulitis and reflux. However more recently worsening episodic epigastric and right upper quadrant pain associated with nausea and vomiting that is classic for biliary tract disease. Ultrasound has shown sludge and probable small stones.  After preoperative discussion regarding benefits and risks detailed elsewhere the patient is electively admitted for laparoscopic cholecystectomy with cholangiogram.  Hospital Course: On the morning of admission the patient underwent an uneventful laparoscopic cholecystectomy with negative intraoperative cholangiogram.  She was noted to have a fatty liver but no evidence of cirrhosis.  She was observed overnight due to her history of obstructive sleep apnea and intolerance of CPAP.  She had no postoperative difficulties.  The following morning she is ambulatory with just expected soreness.  Abdomen is benign.  Wounds are clean.  She is felt ready for discharge.  Disposition: Home  Patient Instructions:  Allergies as of 07/05/2018      Reactions   Tetracyclines & Related Shortness Of Breath   Augmentin [amoxicillin-pot Clavulanate] Nausea And Vomiting   Has patient had a PCN reaction causing immediate rash, facial/tongue/throat swelling, SOB or lightheadedness with hypotension: No Has patient had a PCN reaction causing severe rash involving mucus membranes or skin necrosis: No Has  patient had a PCN reaction that required hospitalization: No Has patient had a PCN reaction occurring within the last 10 years:No If all of the above answers are "NO", then may proceed with Cephalosporin use. "stomach uspet"   Calcium Channel Blockers Other (See Comments)   abd cramps with propanolol   Codeine Nausea Only   Hydrocodone-acetaminophen    DYSPNEA   Other    NO BLOOD PRODUCTS   Primidone Other (See Comments)   numnbess and vivid nightmares    Wellbutrin [bupropion] Other (See Comments)   wellbutrin xl =paresias      Medication List    TAKE these medications   cetirizine 10 MG tablet Commonly known as:  ZYRTEC Take 10 mg by mouth daily as needed for allergies.   docusate sodium 100 MG capsule Commonly known as:  COLACE Take 100 mg by mouth daily.   losartan 50 MG tablet Commonly known as:  COZAAR Take 50 mg by mouth daily.   LUBRICANT EYE DROPS 0.4-0.3 % Soln Generic drug:  Polyethyl Glycol-Propyl Glycol Place 1 drop into both eyes 4 (four) times daily as needed (dry/irritated eyes).   metoprolol succinate 50 MG 24 hr tablet Commonly known as:  TOPROL-XL Take 50 mg by mouth daily.   omeprazole 20 MG capsule Commonly known as:  PRILOSEC Take 20 mg by mouth daily before breakfast.   promethazine 25 MG tablet Commonly known as:  PHENERGAN Take 25 mg by mouth every 6 (six) hours as needed for nausea or vomiting.   SUPER B COMPLEX PO Take 1 tablet by mouth daily.   traMADol 50 MG tablet Commonly known as:  ULTRAM Take 1 tablet (50 mg total) by mouth every 6 (six) hours as needed (mild pain).   UNABLE TO FIND biotine soltion for dry mouth as needed   Vitamin D-3 5000 units Tabs  Take 5,000 Units by mouth daily.       Activity: activity as tolerated Diet: cardiac diet Wound Care: none needed  Follow-up:  With Dr. Excell Seltzer in 3 weeks.  Signed: Edward Jolly MD, FACS  07/05/2018, 12:39 PM

## 2018-07-05 NOTE — Discharge Instructions (Signed)
[facility] ______[facility] °LAPAROSCOPIC SURGERY: POST OP INSTRUCTIONS °Always review your discharge instruction sheet given to you by the facility where your surgery was performed. °IF YOU HAVE DISABILITY OR FAMILY LEAVE FORMS, YOU MUST BRING THEM TO THE OFFICE FOR PROCESSING.   °DO NOT GIVE THEM TO YOUR DOCTOR. ° °1. A prescription for pain medication may be given to you upon discharge.  Take your pain medication as prescribed, if needed.  If narcotic pain medicine is not needed, then you may take acetaminophen (Tylenol) or ibuprofen (Advil) as needed. °2. Take your usually prescribed medications unless otherwise directed. °3. If you need a refill on your pain medication, please contact your pharmacy.  They will contact our office to request authorization. Prescriptions will not be filled after 5pm or on week-ends. °4. You should follow a light diet the first few days after arrival home, such as soup and crackers, etc.  Be sure to include lots of fluids daily. °5. Most patients will experience some swelling and bruising in the area of the incisions.  Ice packs will help.  Swelling and bruising can take several days to resolve.  °6. It is common to experience some constipation if taking pain medication after surgery.  Increasing fluid intake and taking a stool softener (such as Colace) will usually help or prevent this problem from occurring.  A mild laxative (Milk of Magnesia or Miralax) should be taken according to package instructions if there are no bowel movements after 48 hours. °7. Unless discharge instructions indicate otherwise, you may remove your bandages 24-48 hours after surgery, and you may shower at that time.  You may have steri-strips (small skin tapes) in place directly over the incision.  These strips should be left on the skin for 7-10 days.  If your surgeon used skin glue on the incision, you may shower in 24 hours.  The glue will flake off over the next 2-3 weeks.  Any sutures or  staples will be removed at the office during your follow-up visit. °8. ACTIVITIES:  You may resume regular (light) daily activities beginning the next day--such as daily self-care, walking, climbing stairs--gradually increasing activities as tolerated.  You may have sexual intercourse when it is comfortable.  Refrain from any heavy lifting or straining until approved by your doctor. °a. You may drive when you are no longer taking prescription pain medication, you can comfortably wear a seatbelt, and you can safely maneuver your car and apply brakes. °b. RETURN TO WORK:  __________________________________________________________ °9. You should see your doctor in the office for a follow-up appointment approximately 2-3 weeks after your surgery.  Make sure that you call for this appointment within a day or two after you arrive home to insure a convenient appointment time. °10. OTHER INSTRUCTIONS: __________________________________________________________________________________________________________________________ __________________________________________________________________________________________________________________________ °WHEN TO CALL YOUR DOCTOR: °1. Fever over 101.0 °2. Inability to urinate °3. Continued bleeding from incision. °4. Increased pain, redness, or drainage from the incision. °5. Increasing abdominal pain ° °The clinic staff is available to answer your questions during regular business hours.  Please don’t hesitate to call and ask to speak to one of the nurses for clinical concerns.  If you have a medical emergency, go to the nearest emergency room or call 911.  A surgeon from [facility] is always on call at the hospital. °[address], [address], [place], [place]  [address] ? [address] [address], [place], [place]   [address] °[phone] ? [phone] ? FAX [phone] °Web site:   [email or web address] °

## 2018-07-21 DIAGNOSIS — Z0000 Encounter for general adult medical examination without abnormal findings: Secondary | ICD-10-CM | POA: Diagnosis not present

## 2018-07-21 DIAGNOSIS — M1611 Unilateral primary osteoarthritis, right hip: Secondary | ICD-10-CM | POA: Diagnosis not present

## 2018-07-21 DIAGNOSIS — K219 Gastro-esophageal reflux disease without esophagitis: Secondary | ICD-10-CM | POA: Diagnosis not present

## 2018-07-21 DIAGNOSIS — I10 Essential (primary) hypertension: Secondary | ICD-10-CM | POA: Diagnosis not present

## 2018-10-29 IMAGING — RF DG CHOLANGIOGRAM OPERATIVE
1 series · 6 of 6 positions shown · non-contrast
Comparison: Prior right upper quadrant ultrasound 05/19/2018

CLINICAL DATA: 71-year-old female with elevated LFTs and
cholelithiasis undergoing elective cholecystectomy.

EXAM:
INTRAOPERATIVE CHOLANGIOGRAM
TECHNIQUE: Cholangiographic images from the C-arm fluoroscopic device were
submitted for interpretation post-operatively. Please see the
procedural report for the amount of contrast and the fluoroscopy
time utilized.

[Series 1: run · 3 acquisitions, 6 frames shown]
[im 1/3]
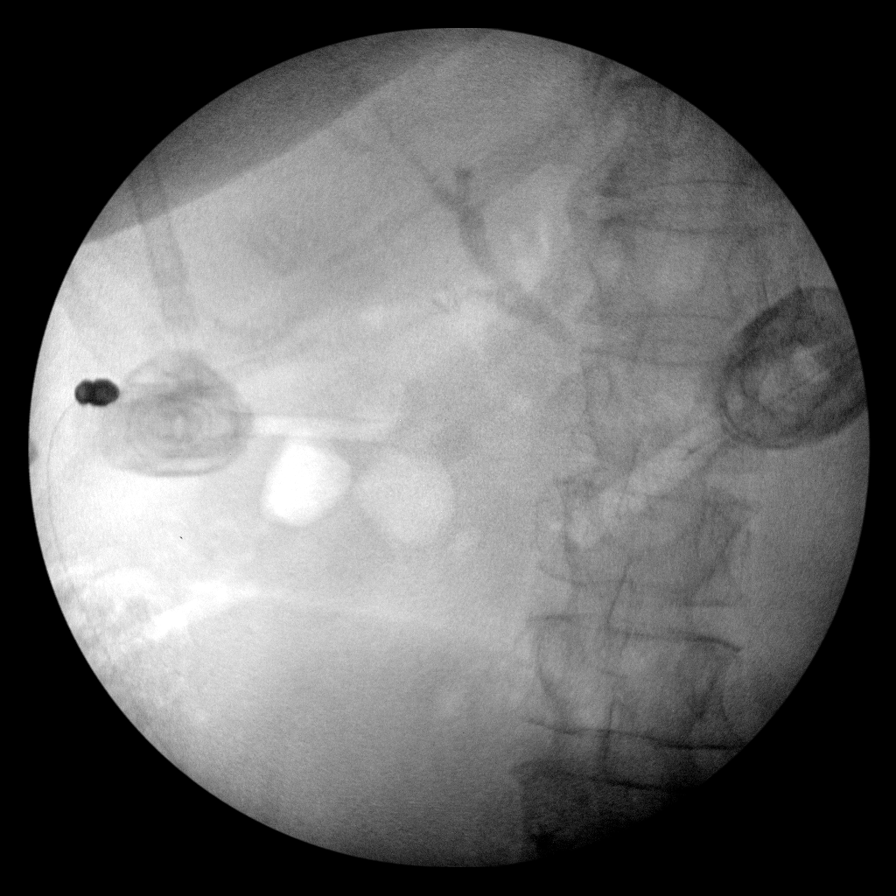
[im 2/3]
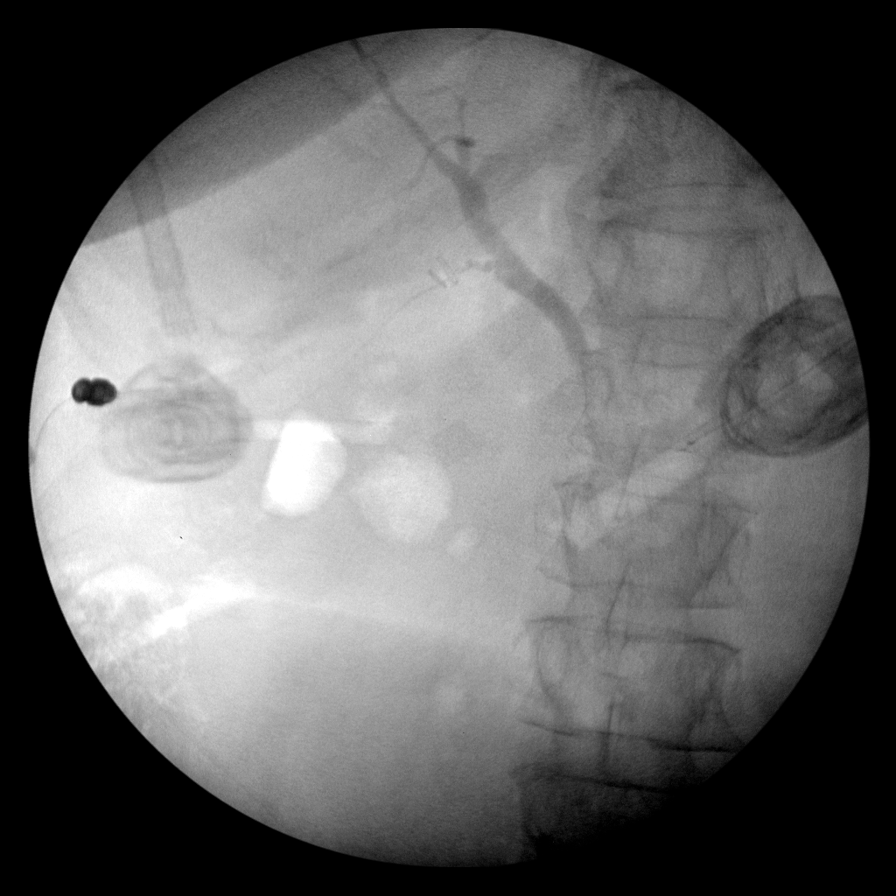
[im 2/3]
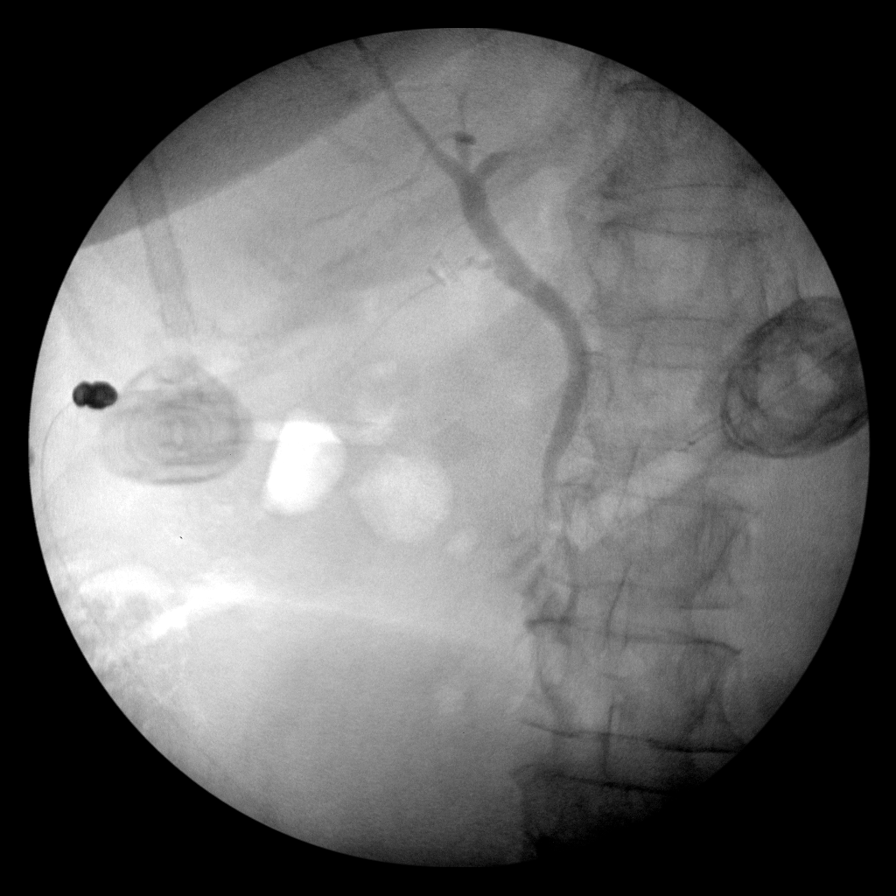
[im 2/3]
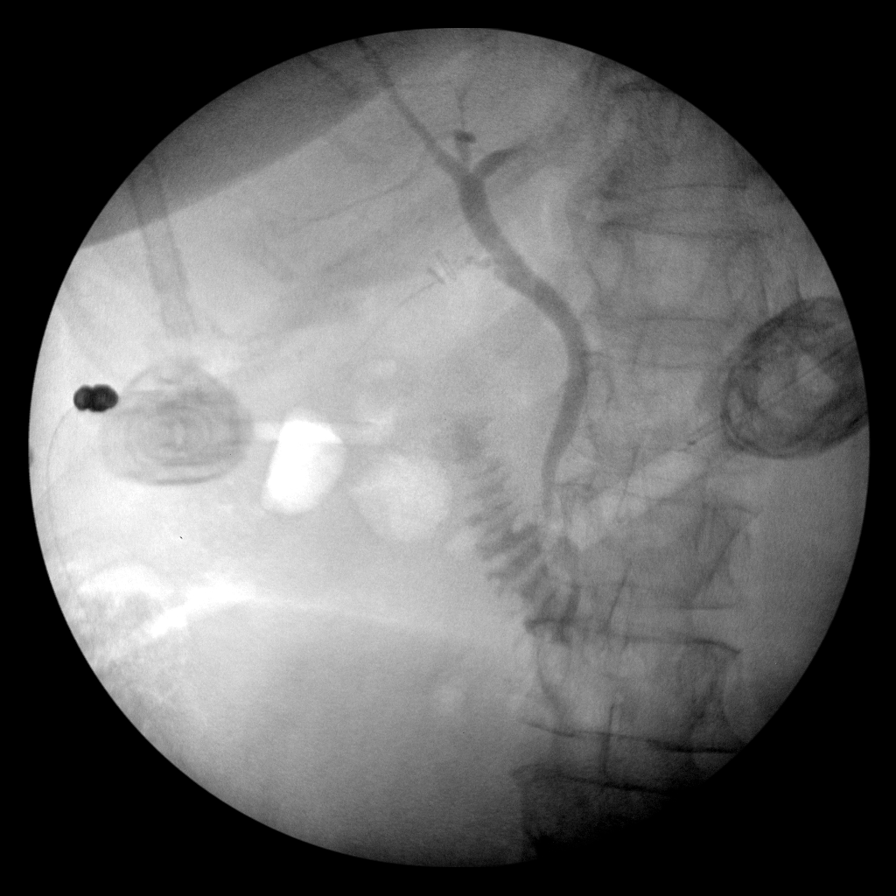
[im 2/3]
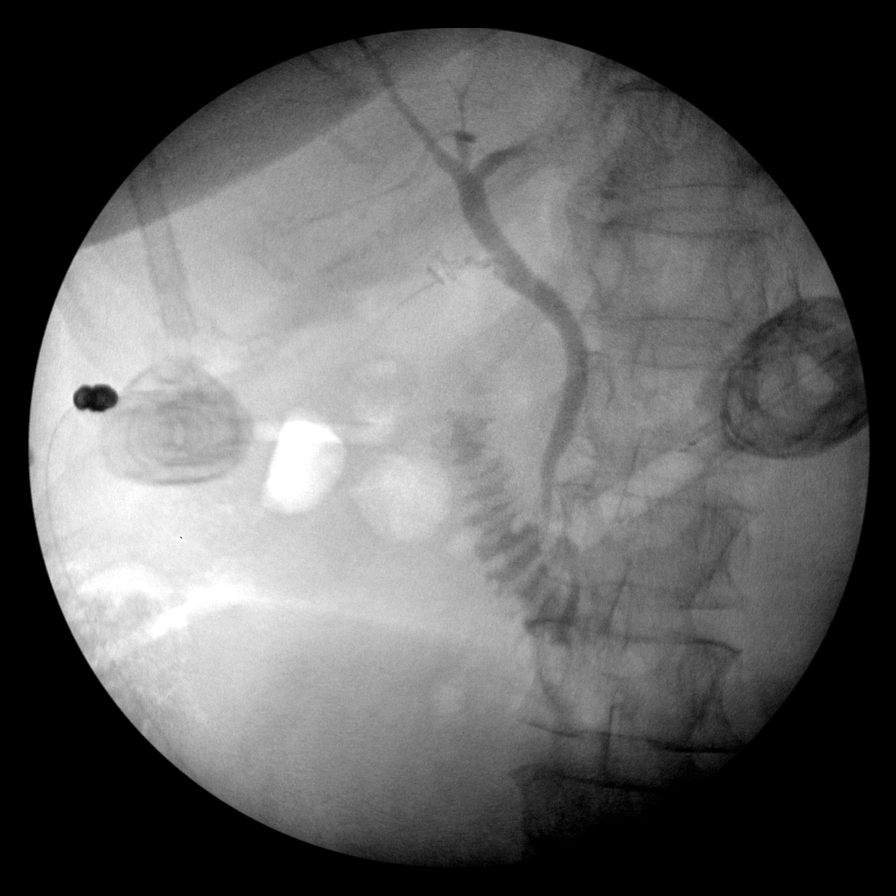
[im 3/3]
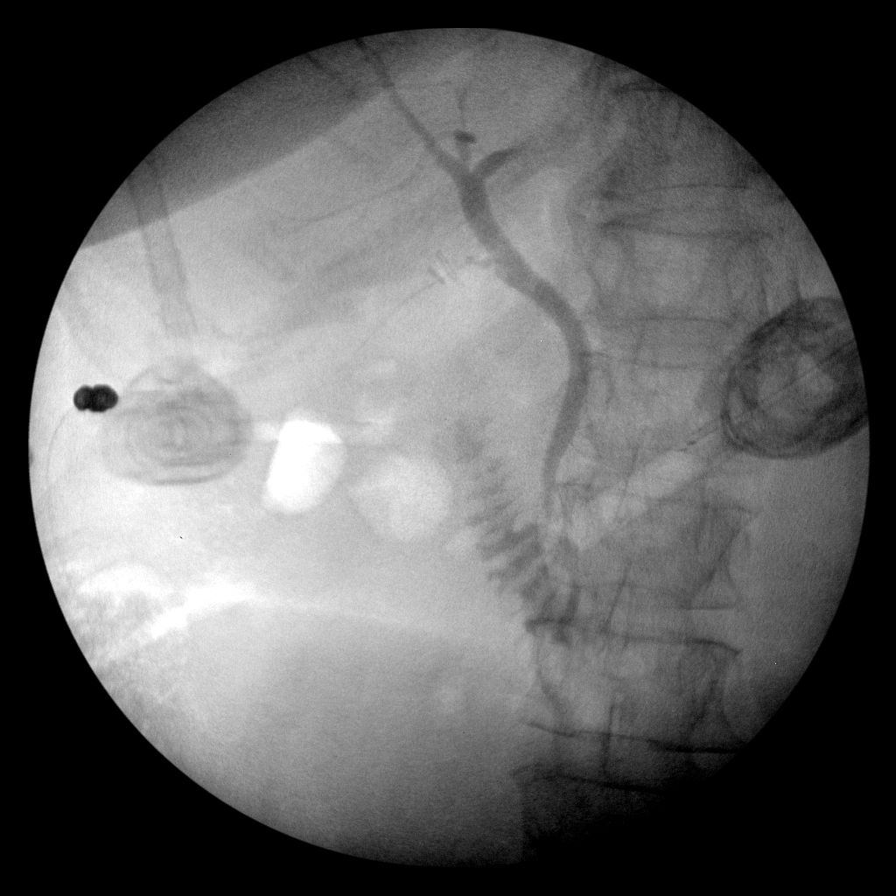

[6 of 6 positions shown; findings below may reference images not displayed]

FINDINGS: Multiple intraoperative saved images and a cine clip are submitted
for review. The images demonstrate cannulation of the cystic duct
remanent and cholangiogram. No evidence of biliary ductal
dilatation, stenosis, stricture or choledocholithiasis. Contrast
material passes through the ampulla and into the duodenum.
IMPRESSION: Negative intraoperative cholangiogram.

## 2018-11-16 DIAGNOSIS — L0231 Cutaneous abscess of buttock: Secondary | ICD-10-CM | POA: Diagnosis not present

## 2019-01-05 DIAGNOSIS — D4981 Neoplasm of unspecified behavior of retina and choroid: Secondary | ICD-10-CM | POA: Diagnosis not present

## 2019-01-05 DIAGNOSIS — H43813 Vitreous degeneration, bilateral: Secondary | ICD-10-CM | POA: Diagnosis not present

## 2019-01-05 DIAGNOSIS — H524 Presbyopia: Secondary | ICD-10-CM | POA: Diagnosis not present

## 2019-01-05 DIAGNOSIS — H10413 Chronic giant papillary conjunctivitis, bilateral: Secondary | ICD-10-CM | POA: Diagnosis not present

## 2019-01-19 DIAGNOSIS — E559 Vitamin D deficiency, unspecified: Secondary | ICD-10-CM | POA: Diagnosis not present

## 2019-01-19 DIAGNOSIS — M6530 Trigger finger, unspecified finger: Secondary | ICD-10-CM | POA: Diagnosis not present

## 2019-01-19 DIAGNOSIS — I10 Essential (primary) hypertension: Secondary | ICD-10-CM | POA: Diagnosis not present

## 2019-01-19 DIAGNOSIS — F322 Major depressive disorder, single episode, severe without psychotic features: Secondary | ICD-10-CM | POA: Diagnosis not present

## 2019-01-19 DIAGNOSIS — R002 Palpitations: Secondary | ICD-10-CM | POA: Diagnosis not present

## 2019-02-06 IMAGING — US US ABDOMEN COMPLETE
1 series · 13 of 25 positions shown · non-contrast
Comparison: CT abdomen pelvis 10/05/2014, 12/03/2011. Abdominal
ultrasound 01/03/2009.

CLINICAL DATA: Chronic 30+ year history of intermittent episodes of
BILATERAL UPPER quadrant abdominal pain, the most recent LEFT
episode occurred last week and was associated with nausea and
vomiting.

EXAM:
ABDOMEN ULTRASOUND COMPLETE

[Series 1: us abdomen complete · 0.14mm/px · 13 of 114 slices shown]
[im 1/114]
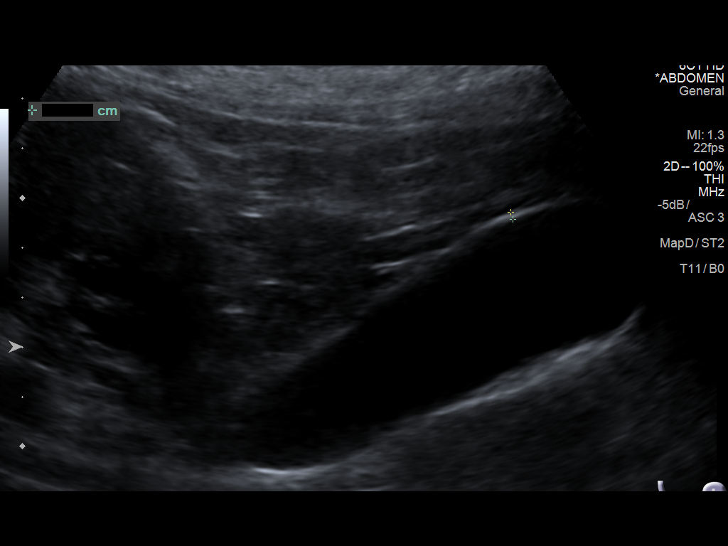
[im 10/114]
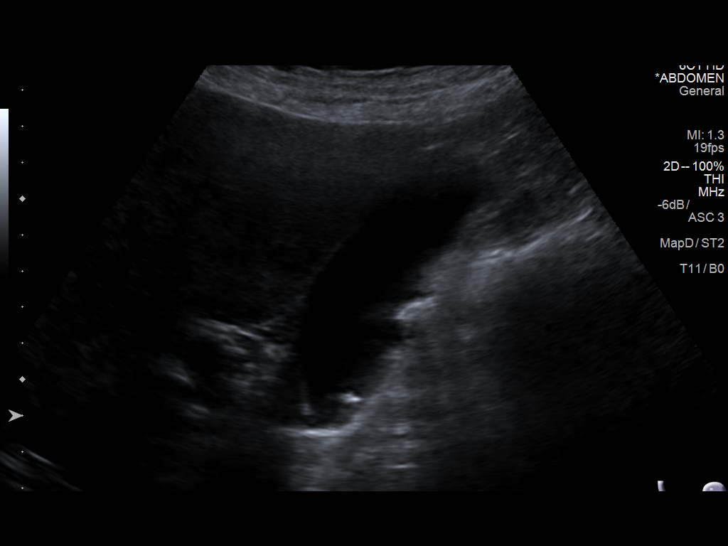
[im 19/114]
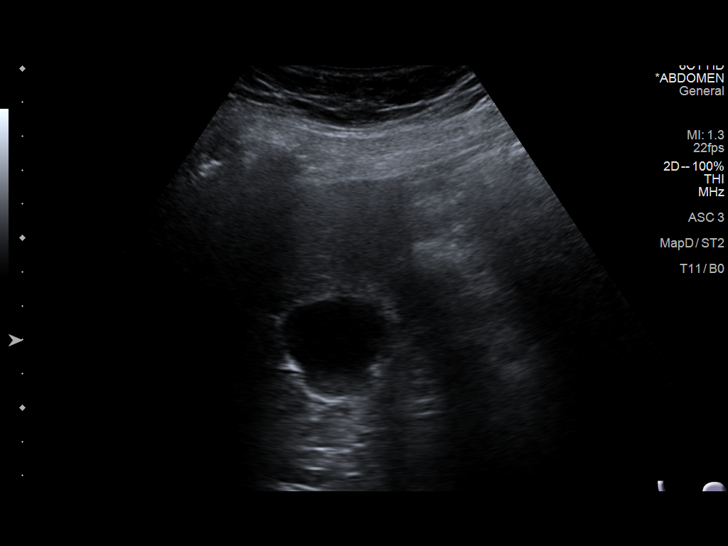
[im 29/114]
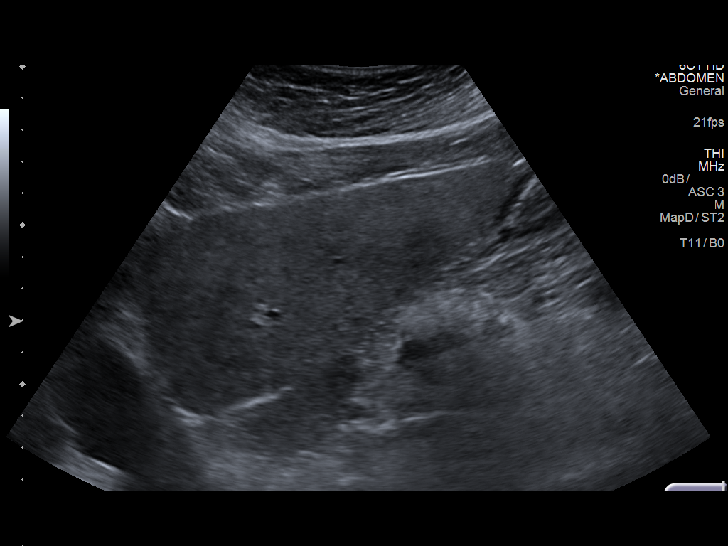
[im 38/114]
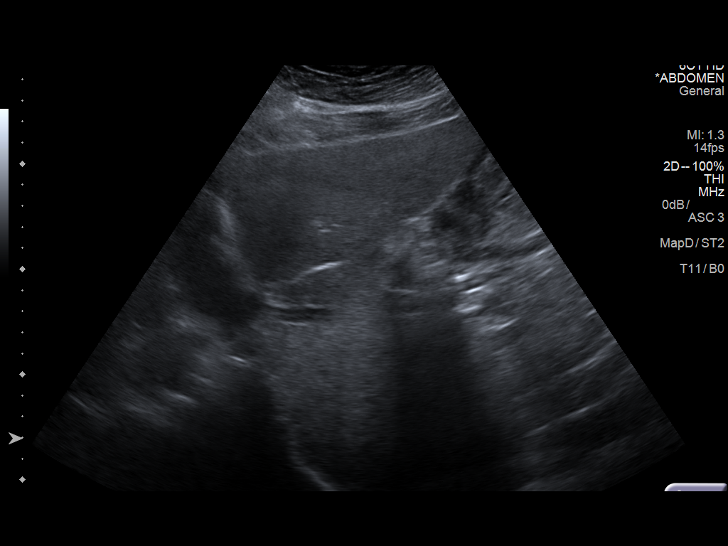
[im 48/114]
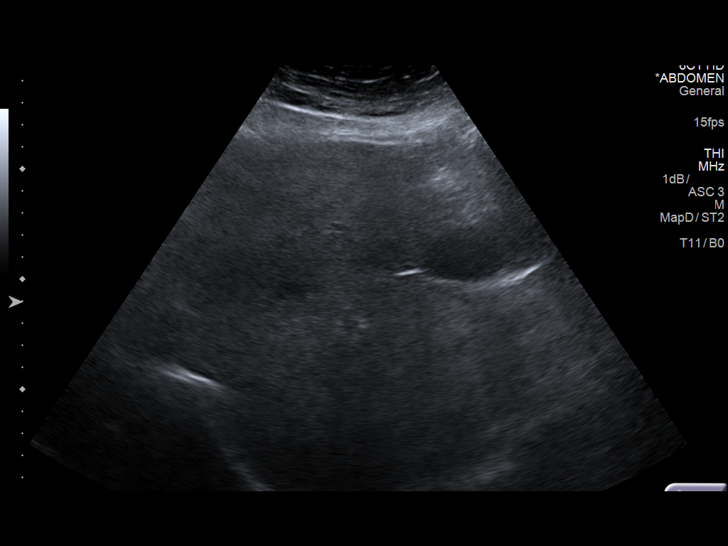
[im 57/114]
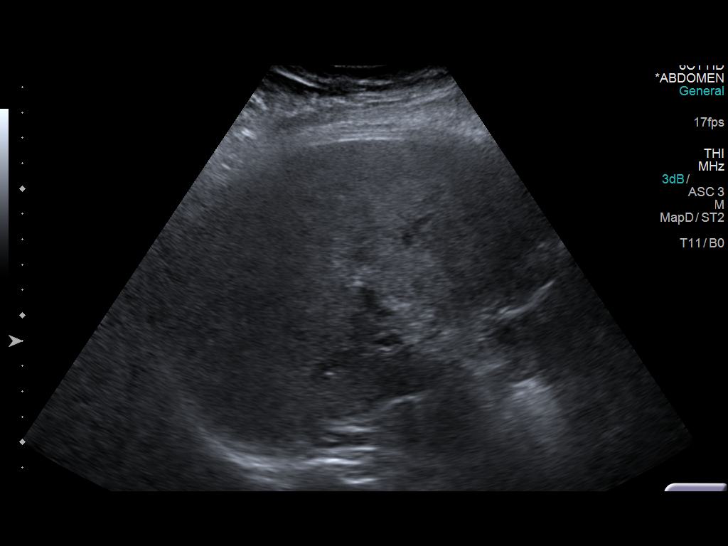
[im 66/114]
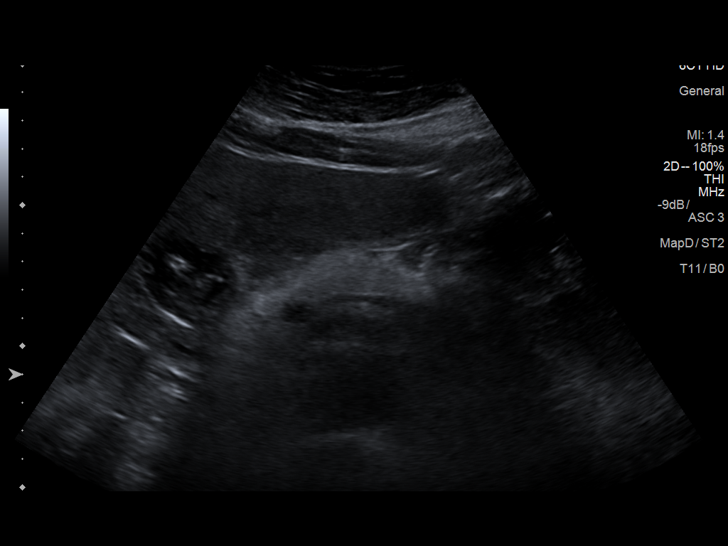
[im 76/114]
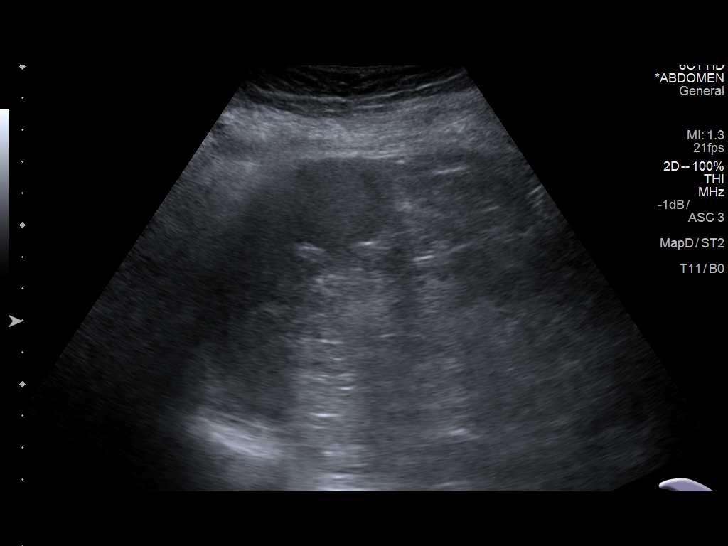
[im 85/114]
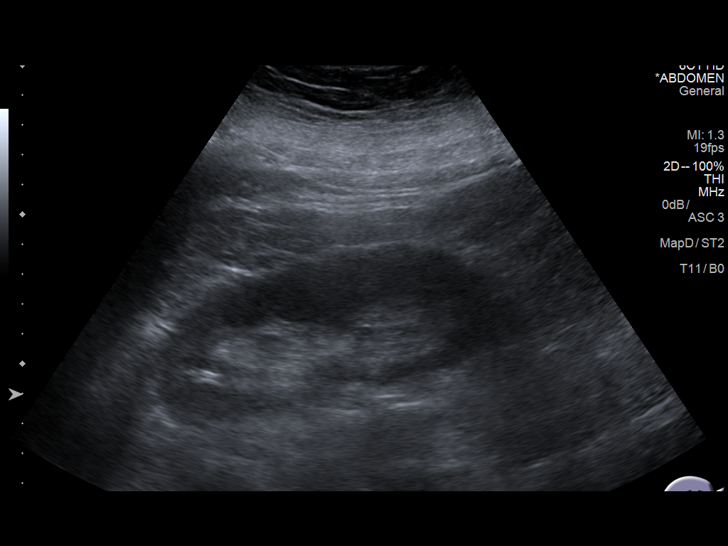
[im 95/114]
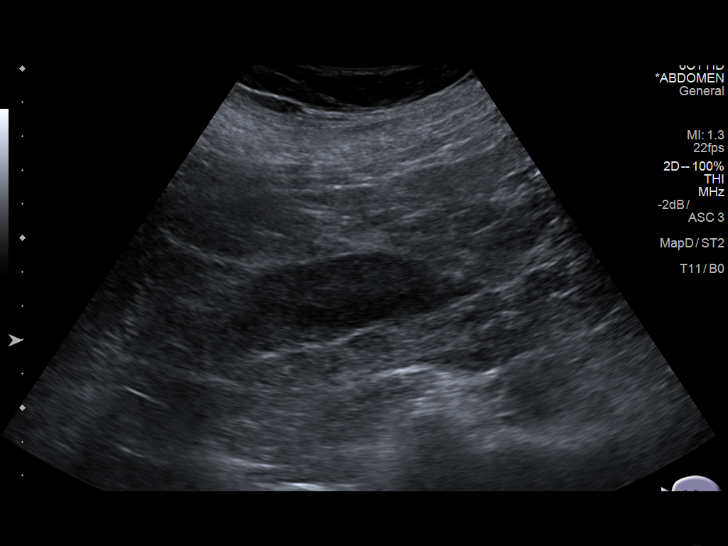
[im 104/114]
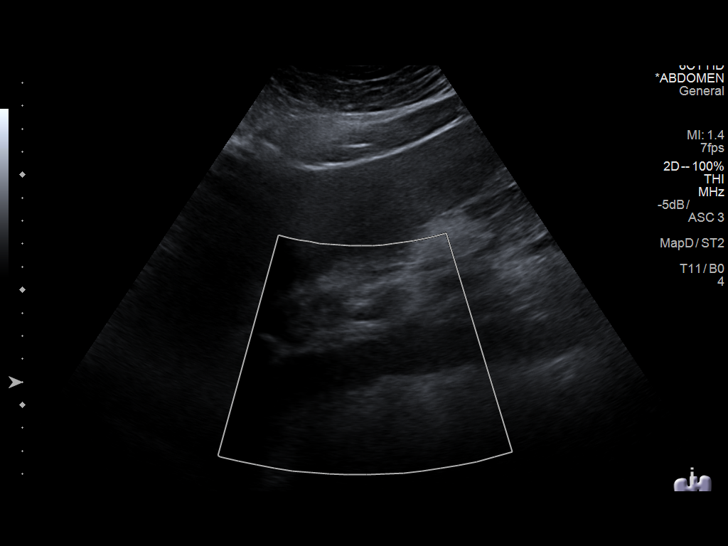
[im 114/114]
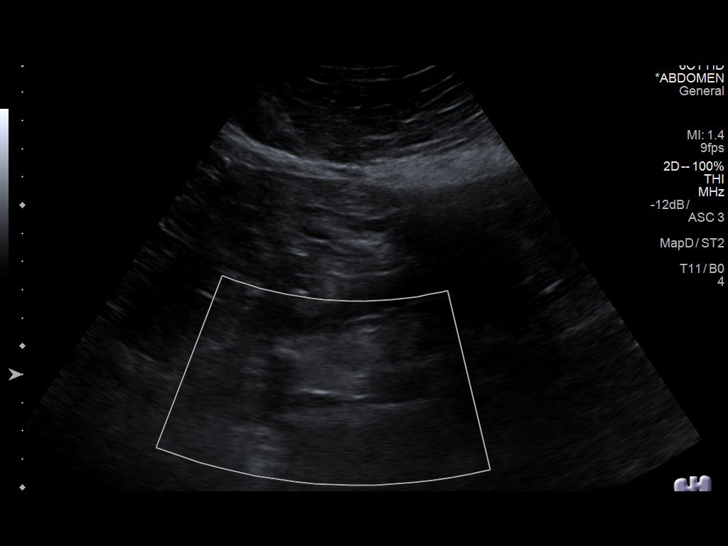

[13 of 25 positions shown; findings below may reference images not displayed]

FINDINGS: Gallbladder: Echogenic sludge and likely very small gallstones
layering dependently in the gallbladder. No gallbladder wall
thickening or pericholecystic fluid. No sonographic Murphy's sign
according to the ultrasound technologist.

Common bile duct: Diameter: 5 mm

Liver: Diffusely increased and coarsened echotexture without focal
hepatic parenchymal abnormality. Portal vein is patent on color
Doppler imaging with normal direction of blood flow towards the
liver.

IVC: Patent.

Pancreas: While difficult to visualize in its entirety, visualized
portions normal in appearance.

Spleen: Normal size and echotexture without focal parenchymal
abnormality.

Right Kidney: Length: Approximately 12.2 cm. No hydronephrosis.
Well-preserved cortex. No shadowing calculi. Normal parenchymal
echotexture. No focal parenchymal abnormality.

Left Kidney: Length: Approximately 12.1 cm. No hydronephrosis.
Well-preserved cortex. No shadowing calculi. Normal parenchymal
echotexture. No focal parenchymal abnormality.

Abdominal aorta: Normal in caliber throughout its visualized course
in the abdomen without evidence of significant atherosclerosis.
Maximum diameter 2.5 cm.

Other findings: None.
IMPRESSION: 1. Echogenic sludge and likely very small gallstones within the
gallbladder. No evidence of cholecystitis.
2. Hepatic steatosis and/or hepatocellular disease without focal
hepatic parenchymal abnormality.
3. Otherwise normal examination.

## 2019-03-07 ENCOUNTER — Telehealth: Payer: Self-pay

## 2019-03-07 NOTE — Telephone Encounter (Signed)

## 2019-03-07 NOTE — Telephone Encounter (Signed)
Called pt to set up possible evisit left message asking pt to call the office.

## 2019-03-13 NOTE — Progress Notes (Signed)
Virtual Visit via Video Note   This visit type was conducted due to national recommendations for restrictions regarding the COVID-19 Pandemic (e.g. social distancing) in an effort to limit this patient's exposure and mitigate transmission in our community.  Due to her co-morbid illnesses, this patient is at least at moderate risk for complications without adequate follow up.  This format is felt to be most appropriate for this patient at this time.  All issues noted in this document were discussed and addressed.  A limited physical exam was performed with this format.  Please refer to the patient's chart for her consent to telehealth for [facility].  Converted to telephone visit due to patient's lack of functioning camera.  Date:  03/14/2019   ID:  Heidi Byrd, DOB [birth year], MRN [id number]  Patient Location: Home Provider Location: Home  PCP:  Orpah Melter, MD  Cardiologist:  No primary care provider on file. [place] Electrophysiologist:  None   Evaluation Performed:  New Patient Evaluation  Chief Complaint:  Palpitations  History of Present Illness:    Heidi Byrd is a 73 y.o. female with HTN, GERD.  She was seen for chest discomfort in 2014 by Dr. Percival Spanish.  ETT in 2014.  She had been having palpitations intermittently over the past few weeks.  They were daily.  No syncope.  Episodes could be up to a few years.  She has been under stress since her ex-husband is in prison.  THis has been stressful for her daughters.  She was also told that she had fatty liver and this was a source of stress.  She has changed her diet to reduce fat intake, although this has fallen off with the coronara-virus.  She has not been walking much.  She has an elliptical, but is limited by back pain.  Her exercise was derailed by depression in the past.    Her beta blocker was increased and her sx have resolved.   Denies : Chest pain. Dizziness. Leg edema. Nitroglycerin use. Orthopnea.  Palpitations. Paroxysmal nocturnal dyspnea. Shortness of breath. Syncope.   She tried stopping her meds for a few days and palpitations returned.  She now takes them daily.  The patient does not have symptoms concerning for COVID-19 infection (fever, chills, cough, or new shortness of breath).    Past Medical History:  Diagnosis Date   Anxiety    Cholelithiasis with chronic cholecystitis 07/04/2018   Cough    due to nausea vomiting no fever had cough since sunday 06-19-18   Depression    Dry eyes    Dry mouth    Essential tremor 04/15/2016   Fatty liver    GERD (gastroesophageal reflux disease)    Headache    hx of migraines prior to menopause, tension headaches now   Hemorrhoids    Hyperglycemia    Hypertension    Migraine headache    Obesity    Precordial pain 09/14/2013   Prediabetes    Right medial knee pain 06/14/2014   Seasonal allergic reaction    Sleep apnea    no cpap used   Vitamin D deficiency    Past Surgical History:  Procedure Laterality Date   CARPAL TUNNEL RELEASE Bilateral 1990's   childbirth  1973, 1974   hemorrahe after one childbirth, no blood given   CHOLECYSTECTOMY N/A 07/04/2018   Procedure: LAPAROSCOPIC CHOLECYSTECTOMY WITH INTRAOPERATIVE CHOLANGIOGRAM;  Surgeon: Excell Seltzer, MD;  Location: [facility];  Service: General;  Laterality: N/A;   colonscopy  8  yrs ago   with benign polyps   EYE SURGERY Bilateral 2016   ioc lens implant for catracts , both  eyes laser surgery for film removal   INCONTINENCE SURGERY  1997   bladder sling  and prlapse correction   KNEE SURGERY Right 2016   later and medial muscle repair    TONSILLECTOMY AND ADENOIDECTOMY  age 63   [place]  2008   x 4 fingers both thums, right pointer and middle finger   TUBAL LIGATION  1980's     Current Meds  Medication Sig   B Complex-C (SUPER B COMPLEX PO) Take 1 tablet by mouth daily.   cetirizine (ZYRTEC) 10 MG tablet Take 10 mg by  mouth daily as needed for allergies.   Cholecalciferol (VITAMIN D-3) 5000 units TABS Take 5,000 Units by mouth daily.   docusate sodium (COLACE) 100 MG capsule Take 100 mg by mouth daily.   losartan (COZAAR) 25 MG tablet Take 25 mg by mouth daily.   metoprolol succinate (TOPROL-XL) 50 MG 24 hr tablet Take 75 mg by mouth daily. Take 1.5 tablets daily by mouth.   omeprazole (PRILOSEC) 20 MG capsule Take 20 mg by mouth daily before breakfast.   Polyethyl Glycol-Propyl Glycol (LUBRICANT EYE DROPS) 0.4-0.3 % SOLN Place 1 drop into both eyes 4 (four) times daily as needed (dry/irritated eyes).   promethazine (PHENERGAN) 25 MG tablet Take 25 mg by mouth every 6 (six) hours as needed for nausea or vomiting.   traMADol (ULTRAM) 50 MG tablet Take 1 tablet (50 mg total) by mouth every 6 (six) hours as needed (mild pain).   UNABLE TO FIND biotine soltion for dry mouth as needed     Allergies:   Tetracyclines & related; Augmentin [amoxicillin-pot clavulanate]; Calcium channel blockers; Codeine; Hydrocodone-acetaminophen; Other; Primidone; and Wellbutrin [bupropion]   Social History   Tobacco Use   Smoking status: Never Smoker   Smokeless tobacco: Never Used  Substance Use Topics   Alcohol use: No   Drug use: No   She is a Jehovah's witness  Family Hx: The patient's family history includes Bone cancer in her maternal grandfather; Diabetes in her mother; Hypertension in her mother; Osteoporosis in her mother.  ROS:   Please see the history of present illness.    Back pain; increased weight gain All other systems reviewed and are negative.   Prior CV studies:   The following studies were reviewed today:  TSH 2.8 in 01/2019; LDL 120 in 01/2019  Labs/Other Tests and Data Reviewed:    EKG:  An ECG dated 06/27/18 was personally reviewed today and demonstrated:  NSR, no ectopic beats  Recent Labs: 06/27/2018: ALT 45; BUN 13; Potassium 4.9; Sodium 141 07/04/2018: Creatinine, Ser 0.71;  Hemoglobin 13.8; Platelets 334   Recent Lipid Panel No results found for: CHOL, TRIG, HDL, CHOLHDL, LDLCALC, LDLDIRECT  Wt Readings from Last 3 Encounters:  03/14/19 251 lb (113.9 kg)  07/04/18 246 lb (111.6 kg)  06/27/18 238 lb (108 kg)     Objective:    Vital Signs:  BP 136/66    Pulse 87    Ht 5' 3"  (1.6 m)    Wt 251 lb (113.9 kg)    BMI 44.46 kg/m    VITAL SIGNS:  reviewed GEN:  no acute distress RESPIRATORY:  no apprent shortness of breath PSYCH:  normal affect exam limited by phone format  ASSESSMENT & PLAN:    1. Palpitations:Sx better.  No high risk features.  Continue beta blocker.  No need for Zio patch now that sx have resolved.  Sx sounded benign.   2. Morbid obesity: We spoke about healthy, plant based diet.  Avoid processed foods. Increase activity gradually.  3. HTN: Losartan at home.  Typically well controlled. The current medical regimen is effective;  continue present plan and medications. 4. Daughter does her grocery shopping.   COVID-19 Education: The signs and symptoms of COVID-19 were discussed with the patient and how to seek care for testing (follow up with PCP or arrange E-visit).  The importance of social distancing was discussed today.  Time:   Today, I have spent 25 minutes with the patient with telehealth technology discussing the above problems.     Medication Adjustments/Labs and Tests Ordered: Current medicines are reviewed at length with the patient today.  Concerns regarding medicines are outlined above.   Tests Ordered: No orders of the defined types were placed in this encounter.   Medication Changes: No orders of the defined types were placed in this encounter.   Disposition:  Follow up prn  Signed, Larae Grooms, MD  03/14/2019 8:43 AM    [place]

## 2019-03-14 ENCOUNTER — Telehealth (INDEPENDENT_AMBULATORY_CARE_PROVIDER_SITE_OTHER): Payer: PPO | Admitting: Interventional Cardiology

## 2019-03-14 ENCOUNTER — Other Ambulatory Visit: Payer: Self-pay

## 2019-03-14 ENCOUNTER — Encounter: Payer: Self-pay | Admitting: Interventional Cardiology

## 2019-03-14 DIAGNOSIS — R002 Palpitations: Secondary | ICD-10-CM | POA: Diagnosis not present

## 2019-03-14 DIAGNOSIS — I10 Essential (primary) hypertension: Secondary | ICD-10-CM | POA: Diagnosis not present

## 2019-03-14 NOTE — Patient Instructions (Signed)
Medication Instructions:  Your physician recommends that you continue on your current medications as directed. Please refer to the Current Medication list given to you today.  If you need a refill on your cardiac medications before your next appointment, please call your pharmacy.   Lab work: None Ordered  If you have labs (blood work) drawn today and your tests are completely normal, you will receive your results only by: Marland Kitchen MyChart Message (if you have MyChart) OR . A paper copy in the mail If you have any lab test that is abnormal or we need to change your treatment, we will call you to review the results.  Testing/Procedures: None ordered  Follow-Up: Follow up AS NEEDED  Any Other Special Instructions Will Be Listed Below (If Applicable).

## 2019-05-06 ENCOUNTER — Emergency Department (HOSPITAL_COMMUNITY)
Admission: EM | Admit: 2019-05-06 | Discharge: 2019-05-06 | Disposition: A | Discharge status: Home / Self Care | Payer: PPO | Attending: Emergency Medicine | Admitting: Emergency Medicine

## 2019-05-06 ENCOUNTER — Encounter (HOSPITAL_COMMUNITY): Payer: Self-pay

## 2019-05-06 ENCOUNTER — Other Ambulatory Visit: Payer: Self-pay

## 2019-05-06 DIAGNOSIS — I10 Essential (primary) hypertension: Secondary | ICD-10-CM | POA: Insufficient documentation

## 2019-05-06 DIAGNOSIS — R58 Hemorrhage, not elsewhere classified: Secondary | ICD-10-CM | POA: Diagnosis not present

## 2019-05-06 DIAGNOSIS — Z79899 Other long term (current) drug therapy: Secondary | ICD-10-CM | POA: Diagnosis not present

## 2019-05-06 DIAGNOSIS — R040 Epistaxis: Secondary | ICD-10-CM | POA: Diagnosis not present

## 2019-05-06 LAB — COMPREHENSIVE METABOLIC PANEL
ALT: 57 U/L — ABNORMAL HIGH (ref 0–44)
AST: 93 U/L — ABNORMAL HIGH (ref 15–41)
Albumin: 3.9 g/dL (ref 3.5–5.0)
Alkaline Phosphatase: 103 U/L (ref 38–126)
Anion gap: 11 (ref 5–15)
BUN: 11 mg/dL (ref 8–23)
CO2: 24 mmol/L (ref 22–32)
Calcium: 9.1 mg/dL (ref 8.9–10.3)
Chloride: 103 mmol/L (ref 98–111)
Creatinine, Ser: 0.59 mg/dL (ref 0.44–1.00)
GFR calc Af Amer: >60 mL/min (ref >60)
GFR calc non Af Amer: >60 mL/min (ref >60)
Glucose, Bld: 187 mg/dL — ABNORMAL HIGH (ref 70–99)
Potassium: 4.2 mmol/L (ref 3.5–5.1)
Sodium: 138 mmol/L (ref 135–145)
Total Bilirubin: 1 mg/dL (ref 0.3–1.2)
Total Protein: 8.2 g/dL — ABNORMAL HIGH (ref 6.5–8.1)

## 2019-05-06 LAB — APTT: aPTT: 30 seconds (ref 24–36)

## 2019-05-06 LAB — CBC WITH DIFFERENTIAL/PLATELET
Abs Immature Granulocytes: 0.06 10*3/uL (ref 0.00–0.07)
Basophils Absolute: 0.1 10*3/uL (ref 0.0–0.1)
Basophils Relative: 1 %
Eosinophils Absolute: 0.2 10*3/uL (ref 0.0–0.5)
Eosinophils Relative: 2 %
HCT: 46.5 % — ABNORMAL HIGH (ref 36.0–46.0)
Hemoglobin: 14.3 g/dL (ref 12.0–15.0)
Immature Granulocytes: 1 %
Lymphocytes Relative: 17 %
Lymphs Abs: 1.9 10*3/uL (ref 0.7–4.0)
MCH: 27.6 pg (ref 26.0–34.0)
MCHC: 30.8 g/dL (ref 30.0–36.0)
MCV: 89.6 fL (ref 80.0–100.0)
Monocytes Absolute: 0.9 10*3/uL (ref 0.1–1.0)
Monocytes Relative: 8 %
Neutro Abs: 7.9 10*3/uL — ABNORMAL HIGH (ref 1.7–7.7)
Neutrophils Relative %: 71 %
Platelets: 349 10*3/uL (ref 150–400)
RBC: 5.19 MIL/uL — ABNORMAL HIGH (ref 3.87–5.11)
RDW: 13.6 % (ref 11.5–15.5)
WBC: 11.1 10*3/uL — ABNORMAL HIGH (ref 4.0–10.5)
nRBC: 0 % (ref 0.0–0.2)

## 2019-05-06 LAB — PROTIME-INR
INR: 1 (ref 0.8–1.2)
Prothrombin Time: 12.7 seconds (ref 11.4–15.2)

## 2019-05-06 MED ORDER — AFRIN NASAL SPRAY 0.05 % NA SOLN: 1.0000 | Freq: Two times a day (BID) | NASAL | 0 refills | Status: DC

## 2019-05-06 MED ORDER — TRANEXAMIC ACID 1000 MG/10ML IV SOLN
500.0000 mg | Freq: Once | INTRAVENOUS | Status: AC
  Administered 2019-05-06: 500 mg via TOPICAL
  Filled 2019-05-06: qty 5

## 2019-05-06 MED ORDER — ACETAMINOPHEN 325 MG PO TABS
650.0000 mg | ORAL_TABLET | Freq: Once | ORAL | Status: AC
  Administered 2019-05-06: 650 mg via ORAL
  Filled 2019-05-06: qty 2

## 2019-05-06 MED ORDER — SODIUM CHLORIDE 0.9 % IN NEBU
3.0000 mL | INHALATION_SOLUTION | Freq: Once | RESPIRATORY_TRACT | Status: AC
  Administered 2019-05-06: 3 mL via RESPIRATORY_TRACT
  Filled 2019-05-06: qty 3

## 2019-05-06 NOTE — ED Triage Notes (Signed)
Pt to ED via EMS, Pt from home, reports she sneezed this morning and nose has been bleeding since. Pt has hx of the same, but reports this is the worse nose bleed she has had. Per EMS notable blood clots, large amount of blood. Mostly left nare, but bleeding from right as well. Hx of HTN. Not on blood thinners.

## 2019-05-06 NOTE — ED Notes (Signed)
Pt reports she did not take her bp medication today

## 2019-05-06 NOTE — ED Notes (Signed)
Bed: AY84 Expected date: 05/06/19 Expected time: 12:40 PM Means of arrival: Ambulance Comments: Nose Bleed HTN

## 2019-05-06 NOTE — ED Notes (Signed)
Pt d/c home per MD order. Discharge summary reviewed, pt verbalizes understanding. No s/s of acute distress noted. Reports daughter is here for discharge ride home.

## 2019-05-06 NOTE — Discharge Instructions (Addendum)
You had a nose bleed which stopped spontaneously. Nose bleeds can recur however, so please read the instructions below.  If the bleeding recurs, please apply direct pressure to the nose for 5 minutes straight, breathing from the mouth and spitting out any blood. After 5 minutes of holding pressure, if the bleeding continues, do the same thing again for 5 more minutes. If after 2nd round of holding pressure the bleeding persists - clear the nose and apply afrin spray. After applying afrin spray to the nares, hold pressure again for 5 minutes. If the bleeding continues despite these measures, then come to the ER while holding direct pressure to the nares.  For now - please do not blow your nose, or put fingers in your nose to clear up any clots.

## 2019-05-06 NOTE — ED Provider Notes (Addendum)
[facility] DEPT Provider Note   CSN: [id number] Arrival date & time: 05/06/19  1248    History   Chief Complaint Chief Complaint  Patient presents with  . Epistaxis    HPI Ardyce Heyer is a 73 y.o. female.     HPI  73 year old female with history of seasonal allergies and prior epistaxis comes in with chief complaint of bleeding from her nose.  She reports that around 11:00 she had her routine round of sneezing.  At the end of her last knee she started having heavy amount of bleeding.  The bleeding has been primarily from the left nare, but 2 hours into her bleeding now she is also having blood come out of the right nare.  She has passed multiple large clots.   Patient is not on any blood thinners and she denies taking aspirin or Plavix.  She has had seasonal allergies for years and has had epistaxis in the past requiring cauterization in the winter months.  She denies any difficulty in breathing or pain anywhere.  Past Medical History:  Diagnosis Date  . Anxiety   . Cholelithiasis with chronic cholecystitis 07/04/2018  . Cough    due to nausea vomiting no fever had cough since sunday 06-19-18  . Depression   . Dry eyes   . Dry mouth   . Essential tremor 04/15/2016  . Fatty liver   . GERD (gastroesophageal reflux disease)   . Headache    hx of migraines prior to menopause, tension headaches now  . Hemorrhoids   . Hyperglycemia   . Hypertension   . Migraine headache   . Obesity   . Precordial pain 09/14/2013  . Prediabetes   . Right medial knee pain 06/14/2014  . Seasonal allergic reaction   . Sleep apnea    no cpap used  . Vitamin D deficiency     Patient Active Problem List   Diagnosis Date Noted  . Cholelithiasis with chronic cholecystitis 07/04/2018  . Essential tremor 04/15/2016  . Right medial knee pain 06/14/2014  . Precordial pain 09/14/2013    Past Surgical History:  Procedure Laterality Date  . CARPAL TUNNEL RELEASE  Bilateral 1990's  . childbirth  1973, 1974   hemorrahe after one childbirth, no blood given  . CHOLECYSTECTOMY N/A 07/04/2018   Procedure: LAPAROSCOPIC CHOLECYSTECTOMY WITH INTRAOPERATIVE CHOLANGIOGRAM;  Surgeon: Excell Seltzer, MD;  Location: [facility];  Service: General;  Laterality: N/A;  . colonscopy  8 yrs ago   with benign polyps  . EYE SURGERY Bilateral 2016   ioc lens implant for catracts , both  eyes laser surgery for film removal  . INCONTINENCE SURGERY  1997   bladder sling  and prlapse correction  . KNEE SURGERY Right 2016   later and medial muscle repair   . TONSILLECTOMY AND ADENOIDECTOMY  age 65  . TRIGGER FINGER RELEASE  2008   x 4 fingers both thums, right pointer and middle finger  . TUBAL LIGATION  1980's     OB History   No obstetric history on file.      Home Medications    Prior to Admission medications   Medication Sig Start Date End Date Taking? Authorizing Provider  acidophilus (RISAQUAD) CAPS capsule Take 1 capsule by mouth daily.   Yes [provider]  B Complex-C (SUPER B COMPLEX PO) Take 1 tablet by mouth daily.   Yes [provider]  cetirizine (ZYRTEC) 10 MG tablet Take 10 mg by mouth daily  as needed for allergies.   Yes [provider]  Cholecalciferol (VITAMIN D-3) 5000 units TABS Take 5,000 Units by mouth daily.   Yes [provider]  losartan (COZAAR) 25 MG tablet Take 25 mg by mouth daily.   Yes [provider]  metoprolol succinate (TOPROL-XL) 50 MG 24 hr tablet Take 75 mg by mouth daily. Take 1.5 tablets daily by mouth. 03/12/16  Yes [provider]  omeprazole (PRILOSEC) 20 MG capsule Take 20 mg by mouth daily before breakfast.   Yes [provider]  Polyethyl Glycol-Propyl Glycol (LUBRICANT EYE DROPS) 0.4-0.3 % SOLN Place 1 drop into both eyes 4 (four) times daily as needed (dry/irritated eyes).   Yes [provider]  oxymetazoline (AFRIN NASAL SPRAY) 0.05 % nasal spray  Place 1 spray into both nostrils 2 (two) times daily. 05/06/19   Varney Biles, MD  traMADol (ULTRAM) 50 MG tablet Take 1 tablet (50 mg total) by mouth every 6 (six) hours as needed (mild pain). Patient not taking: Reported on 05/06/2019 07/05/18   Excell Seltzer, MD    Family History Family History  Problem Relation Age of Onset  . Diabetes Mother   . Hypertension Mother   . Osteoporosis Mother   . Bone cancer Maternal Grandfather     Social History Social History   Tobacco Use  . Smoking status: Never Smoker  . Smokeless tobacco: Never Used  Substance Use Topics  . Alcohol use: No  . Drug use: No     Allergies   Tetracyclines & related, Augmentin [amoxicillin-pot clavulanate], Calcium channel blockers, Codeine, Hydrocodone-acetaminophen, Other, Primidone, and Wellbutrin [bupropion]   Review of Systems Review of Systems  Constitutional: Positive for activity change.  Respiratory: Negative for shortness of breath.   Cardiovascular: Negative for chest pain.  Allergic/Immunologic: Positive for environmental allergies.  Neurological: Negative for dizziness and headaches.  Hematological: Does not bruise/bleed easily.     Physical Exam Updated Vital Signs BP (!) 165/81   Pulse (!) 103   Temp 98.2 F (36.8 C) (Oral)   Resp 16   Ht 5' 3"  (1.6 m)   Wt 112.5 kg   SpO2 96%   BMI 43.93 kg/m   Physical Exam Vitals signs and nursing note reviewed.  Constitutional:      Appearance: She is well-developed.  HENT:     Head: Normocephalic and atraumatic.     Nose:     Comments: Patient has brisk bleeding from her left nare with evidence of clot extending out from inside the left nare. Neck:     Musculoskeletal: Normal range of motion and neck supple.  Cardiovascular:     Rate and Rhythm: Normal rate.  Pulmonary:     Effort: Pulmonary effort is normal.  Abdominal:     General: Bowel sounds are normal.  Skin:    General: Skin is warm and dry.  Neurological:      Mental Status: She is alert and oriented to person, place, and time.      ED Treatments / Results  Labs (all labs ordered are listed, but only abnormal results are displayed) Labs Reviewed  CBC WITH DIFFERENTIAL/PLATELET - Abnormal; Notable for the following components:      Result Value   WBC 11.1 (*)    RBC 5.19 (*)    HCT 46.5 (*)    Neutro Abs 7.9 (*)    All other components within normal limits  COMPREHENSIVE METABOLIC PANEL - Abnormal; Notable for the following components:  Glucose, Bld 187 (*)    Total Protein 8.2 (*)    AST 93 (*)    ALT 57 (*)    All other components within normal limits  PROTIME-INR  APTT    EKG None  Radiology No results found.  Procedures Procedures (including critical care time)  Medications Ordered in ED Medications  tranexamic acid (CYKLOKAPRON) injection 500 mg (500 mg Topical Given 05/06/19 1320)  sodium chloride 0.9 % nebulizer solution 3 mL (3 mLs Nebulization Given 05/06/19 1320)  acetaminophen (TYLENOL) tablet 650 mg (650 mg Oral Given 05/06/19 1456)     Initial Impression / Assessment and Plan / ED Course  I have reviewed the triage vital signs and the nursing notes.  Pertinent labs & imaging results that were available during my care of the patient were reviewed by me and considered in my medical decision making (see chart for details).  Clinical Course as of May 05 1458  Sat May 06, 2019  1455 Hemoglobin is stable. Patient still has no bleed. Stable for discharge with strict ER return precautions.  Patient has tachycardia, which could be because of the Afrin.  She does not have any symptoms of dizziness, shortness of breath, chest pain.  Hemoglobin: 14.3 [AN]    Clinical Course User Index [AN] Varney Biles, MD       73 year old female with history of epistaxis comes in a chief complaint of bleeding nose.  Her bleeding started after she ended her bout of sneezing.  She is having copious amount of bleeding from the  left side.  We had the patient clean up her nose and had large clots to be able to extract.  Once the clots were removed we applied direct pressure for 5 minutes and the bleeding has slowed down remarkably.  EMS reported that they had given patient 3 rounds of Afrin nasal spray.  Initially the plan was to start packing, however since patient's bleeding has slowed down dramatically we will give her TXA nebulizer treatment and reassess.  Final Clinical Impressions(s) / ED Diagnoses   Final diagnoses:  Anterior epistaxis    ED Discharge Orders         Ordered    oxymetazoline (AFRIN NASAL SPRAY) 0.05 % nasal spray  2 times daily     05/06/19 1455           Varney Biles, MD 05/06/19 [place], Gwen Sarvis, MD 05/06/19 1459

## 2019-05-10 DIAGNOSIS — R040 Epistaxis: Secondary | ICD-10-CM | POA: Diagnosis not present

## 2019-06-07 DIAGNOSIS — H903 Sensorineural hearing loss, bilateral: Secondary | ICD-10-CM | POA: Diagnosis not present

## 2019-06-07 DIAGNOSIS — R040 Epistaxis: Secondary | ICD-10-CM | POA: Diagnosis not present

## 2019-06-07 DIAGNOSIS — G501 Atypical facial pain: Secondary | ICD-10-CM | POA: Diagnosis not present

## 2019-07-10 DIAGNOSIS — M65332 Trigger finger, left middle finger: Secondary | ICD-10-CM | POA: Diagnosis not present

## 2019-07-26 DIAGNOSIS — R040 Epistaxis: Secondary | ICD-10-CM | POA: Diagnosis not present

## 2019-07-26 DIAGNOSIS — Z131 Encounter for screening for diabetes mellitus: Secondary | ICD-10-CM | POA: Diagnosis not present

## 2019-07-26 DIAGNOSIS — Z23 Encounter for immunization: Secondary | ICD-10-CM | POA: Diagnosis not present

## 2019-07-26 DIAGNOSIS — K1230 Oral mucositis (ulcerative), unspecified: Secondary | ICD-10-CM | POA: Diagnosis not present

## 2019-07-26 DIAGNOSIS — Z0000 Encounter for general adult medical examination without abnormal findings: Secondary | ICD-10-CM | POA: Diagnosis not present

## 2019-07-26 DIAGNOSIS — I10 Essential (primary) hypertension: Secondary | ICD-10-CM | POA: Diagnosis not present

## 2019-07-26 DIAGNOSIS — F322 Major depressive disorder, single episode, severe without psychotic features: Secondary | ICD-10-CM | POA: Diagnosis not present

## 2019-07-27 DIAGNOSIS — E1159 Type 2 diabetes mellitus with other circulatory complications: Secondary | ICD-10-CM | POA: Diagnosis not present

## 2019-07-27 DIAGNOSIS — I10 Essential (primary) hypertension: Secondary | ICD-10-CM | POA: Diagnosis not present

## 2019-08-11 DIAGNOSIS — Z1231 Encounter for screening mammogram for malignant neoplasm of breast: Secondary | ICD-10-CM | POA: Diagnosis not present

## 2019-08-15 DIAGNOSIS — Z23 Encounter for immunization: Secondary | ICD-10-CM | POA: Diagnosis not present

## 2019-09-07 DIAGNOSIS — R0781 Pleurodynia: Secondary | ICD-10-CM | POA: Diagnosis not present

## 2019-09-08 DIAGNOSIS — E559 Vitamin D deficiency, unspecified: Secondary | ICD-10-CM | POA: Diagnosis not present

## 2019-09-08 DIAGNOSIS — R0781 Pleurodynia: Secondary | ICD-10-CM | POA: Diagnosis not present

## 2019-11-09 DIAGNOSIS — E1159 Type 2 diabetes mellitus with other circulatory complications: Secondary | ICD-10-CM | POA: Diagnosis not present

## 2019-11-09 DIAGNOSIS — I10 Essential (primary) hypertension: Secondary | ICD-10-CM | POA: Diagnosis not present

## 2019-12-18 DIAGNOSIS — R040 Epistaxis: Secondary | ICD-10-CM | POA: Diagnosis not present

## 2020-01-05 ENCOUNTER — Ambulatory Visit: Payer: PPO | Attending: Internal Medicine

## 2020-01-05 DIAGNOSIS — Z23 Encounter for immunization: Secondary | ICD-10-CM | POA: Insufficient documentation

## 2020-01-05 NOTE — Progress Notes (Signed)
   Covid-19 Vaccination Clinic  Name:  Heidi Byrd    MRN: [id number] DOB: 18-May-1946  01/05/2020  Heidi Byrd was observed post Covid-19 immunization for 15 minutes without incidence. She was provided with Vaccine Information Sheet and instruction to access the V-Safe system.   Heidi Byrd was instructed to call 911 with any severe reactions post vaccine: Marland Kitchen Difficulty breathing  . Swelling of your face and throat  . A fast heartbeat  . A bad rash all over your body  . Dizziness and weakness    Immunizations Administered    Name Date Dose VIS Date Route   Pfizer COVID-19 Vaccine 01/05/2020 11:16 AM 0.3 mL 10/20/2019 Intramuscular   Manufacturer: [place]   Lot: [id number]   [place]: [address]-0

## 2020-01-11 DIAGNOSIS — H43813 Vitreous degeneration, bilateral: Secondary | ICD-10-CM | POA: Diagnosis not present

## 2020-01-11 DIAGNOSIS — Z961 Presence of intraocular lens: Secondary | ICD-10-CM | POA: Diagnosis not present

## 2020-01-11 DIAGNOSIS — D4981 Neoplasm of unspecified behavior of retina and choroid: Secondary | ICD-10-CM | POA: Diagnosis not present

## 2020-01-11 DIAGNOSIS — H10413 Chronic giant papillary conjunctivitis, bilateral: Secondary | ICD-10-CM | POA: Diagnosis not present

## 2020-01-15 DIAGNOSIS — R040 Epistaxis: Secondary | ICD-10-CM | POA: Diagnosis not present

## 2020-01-29 DIAGNOSIS — Z7984 Long term (current) use of oral hypoglycemic drugs: Secondary | ICD-10-CM | POA: Diagnosis not present

## 2020-01-29 DIAGNOSIS — Z1211 Encounter for screening for malignant neoplasm of colon: Secondary | ICD-10-CM | POA: Diagnosis not present

## 2020-01-29 DIAGNOSIS — E1159 Type 2 diabetes mellitus with other circulatory complications: Secondary | ICD-10-CM | POA: Diagnosis not present

## 2020-01-29 DIAGNOSIS — M1611 Unilateral primary osteoarthritis, right hip: Secondary | ICD-10-CM | POA: Diagnosis not present

## 2020-01-29 DIAGNOSIS — K2100 Gastro-esophageal reflux disease with esophagitis, without bleeding: Secondary | ICD-10-CM | POA: Diagnosis not present

## 2020-01-29 DIAGNOSIS — I10 Essential (primary) hypertension: Secondary | ICD-10-CM | POA: Diagnosis not present

## 2020-01-30 ENCOUNTER — Ambulatory Visit: Payer: PPO | Attending: Internal Medicine

## 2020-01-30 DIAGNOSIS — Z23 Encounter for immunization: Secondary | ICD-10-CM

## 2020-01-30 NOTE — Progress Notes (Signed)
   Covid-19 Vaccination Clinic  Name:  KISHIA SHACKETT    MRN: [id number] DOB: [birth year]  01/30/2020  Ms. Glassner was observed post Covid-19 immunization for 30 minutes based on pre-vaccination screening without incident. She was provided with Vaccine Information Sheet and instruction to access the V-Safe system.   Ms. Werden was instructed to call 911 with any severe reactions post vaccine: Marland Kitchen Difficulty breathing  . Swelling of face and throat  . A fast heartbeat  . A bad rash all over body  . Dizziness and weakness   Immunizations Administered    Name Date Dose VIS Date Route   Pfizer COVID-19 Vaccine 01/30/2020  1:46 PM 0.3 mL 10/20/2019 Intramuscular   Manufacturer: [place]   Lot: [id number]   [place]: [address]-4

## 2020-02-13 DIAGNOSIS — R040 Epistaxis: Secondary | ICD-10-CM | POA: Diagnosis not present

## 2020-02-20 DIAGNOSIS — Z1211 Encounter for screening for malignant neoplasm of colon: Secondary | ICD-10-CM | POA: Diagnosis not present

## 2020-03-13 ENCOUNTER — Ambulatory Visit: Payer: PPO | Admitting: Registered"

## 2020-06-21 ENCOUNTER — Encounter (HOSPITAL_COMMUNITY): Payer: Self-pay

## 2020-06-21 ENCOUNTER — Emergency Department (HOSPITAL_COMMUNITY): Payer: PPO

## 2020-06-21 ENCOUNTER — Emergency Department (HOSPITAL_COMMUNITY)
Admission: EM | Admit: 2020-06-21 | Discharge: 2020-06-22 | Disposition: A | Discharge status: Home / Self Care | Payer: PPO | Attending: Emergency Medicine | Admitting: Emergency Medicine

## 2020-06-21 DIAGNOSIS — R110 Nausea: Secondary | ICD-10-CM | POA: Diagnosis not present

## 2020-06-21 DIAGNOSIS — I10 Essential (primary) hypertension: Secondary | ICD-10-CM | POA: Diagnosis not present

## 2020-06-21 DIAGNOSIS — R079 Chest pain, unspecified: Secondary | ICD-10-CM | POA: Diagnosis not present

## 2020-06-21 DIAGNOSIS — Z79899 Other long term (current) drug therapy: Secondary | ICD-10-CM | POA: Insufficient documentation

## 2020-06-21 DIAGNOSIS — R0789 Other chest pain: Secondary | ICD-10-CM | POA: Diagnosis not present

## 2020-06-21 LAB — BASIC METABOLIC PANEL
Anion gap: 11 (ref 5–15)
BUN: 8 mg/dL (ref 8–23)
CO2: 25 mmol/L (ref 22–32)
Calcium: 9.3 mg/dL (ref 8.9–10.3)
Chloride: 101 mmol/L (ref 98–111)
Creatinine, Ser: 0.74 mg/dL (ref 0.44–1.00)
GFR calc Af Amer: >60 mL/min (ref >60)
GFR calc non Af Amer: >60 mL/min (ref >60)
Glucose, Bld: 204 mg/dL — ABNORMAL HIGH (ref 70–99)
Potassium: 4.1 mmol/L (ref 3.5–5.1)
Sodium: 137 mmol/L (ref 135–145)

## 2020-06-21 LAB — TROPONIN I (HIGH SENSITIVITY)
Troponin I (High Sensitivity): 4 ng/L (ref <18)
Troponin I (High Sensitivity): 4 ng/L (ref <18)

## 2020-06-21 LAB — CBC
HCT: 43.2 % (ref 36.0–46.0)
Hemoglobin: 13.2 g/dL (ref 12.0–15.0)
MCH: 26.2 pg (ref 26.0–34.0)
MCHC: 30.6 g/dL (ref 30.0–36.0)
MCV: 85.7 fL (ref 80.0–100.0)
Platelets: 351 10*3/uL (ref 150–400)
RBC: 5.04 MIL/uL (ref 3.87–5.11)
RDW: 14.5 % (ref 11.5–15.5)
WBC: 12.4 10*3/uL — ABNORMAL HIGH (ref 4.0–10.5)
nRBC: 0 % (ref 0.0–0.2)

## 2020-06-21 NOTE — ED Triage Notes (Signed)
Pt BIB [facility] for eval of CP from PCP. Pt went this afternoon for eval of chest pressure onset last night around midnight. Radiation from chest to back and L arm. Pt also reports nausea x 3 days. PCP called EMS for transport for cardiac eval here. No EKG changes. Pt rec'd 324 ASA, 1 SL NTG w/ no change.

## 2020-06-22 LAB — CBG MONITORING, ED: Glucose-Capillary: 190 mg/dL — ABNORMAL HIGH (ref 70–99)

## 2020-06-22 MED ORDER — ONDANSETRON 4 MG PO TBDP
4.0000 mg | ORAL_TABLET | Freq: Once | ORAL | Status: AC
  Administered 2020-06-22: 4 mg via ORAL
  Filled 2020-06-22: qty 1

## 2020-06-22 MED ORDER — ONDANSETRON 4 MG PO TBDP: 4.0000 mg | ORAL_TABLET | Freq: Three times a day (TID) | ORAL | 0 refills | Status: DC | PRN

## 2020-06-22 NOTE — ED Provider Notes (Signed)
[facility] EMERGENCY DEPARTMENT Provider Note   CSN: [id number] Arrival date & time: 06/21/20  3570     History Chief Complaint  Patient presents with  . Chest Pain    Heidi Byrd is a 74 y.o. female.  The history is provided by the patient.  Chest Pain Pain location:  L chest Pain quality: tightness   Pain radiates to:  Does not radiate Pain severity:  Moderate Onset quality:  Gradual Timing:  Rare Progression:  Resolved Chronicity:  New Context: at rest   Relieved by:  Nothing Worsened by:  Nothing Ineffective treatments:  None tried Associated symptoms: no back pain, no cough, no diaphoresis, no fever, no headache, no nausea, no palpitations, no shortness of breath and no vomiting   Risk factors: diabetes mellitus, high cholesterol, hypertension and obesity        Past Medical History:  Diagnosis Date  . Anxiety   . Cholelithiasis with chronic cholecystitis 07/04/2018  . Cough    due to nausea vomiting no fever had cough since sunday 06-19-18  . Depression   . Dry eyes   . Dry mouth   . Essential tremor 04/15/2016  . Fatty liver   . GERD (gastroesophageal reflux disease)   . Headache    hx of migraines prior to menopause, tension headaches now  . Hemorrhoids   . Hyperglycemia   . Hypertension   . Migraine headache   . Obesity   . Precordial pain 09/14/2013  . Prediabetes   . Right medial knee pain 06/14/2014  . Seasonal allergic reaction   . Sleep apnea    no cpap used  . Vitamin D deficiency     Patient Active Problem List   Diagnosis Date Noted  . Cholelithiasis with chronic cholecystitis 07/04/2018  . Essential tremor 04/15/2016  . Right medial knee pain 06/14/2014  . Precordial pain 09/14/2013    Past Surgical History:  Procedure Laterality Date  . CARPAL TUNNEL RELEASE Bilateral 1990's  . childbirth  1973, 1974   hemorrahe after one childbirth, no blood given  . CHOLECYSTECTOMY N/A 07/04/2018   Procedure: LAPAROSCOPIC  CHOLECYSTECTOMY WITH INTRAOPERATIVE CHOLANGIOGRAM;  Surgeon: Excell Seltzer, MD;  Location: [facility] OR[facility];  Service: General;  Laterality: N/A;  . colonscopy  8 yrs ago   with benign polyps  . EYE SURGERY Bilateral 2016   ioc lens implant for catracts , both  eyes laser surgery for film removal  . INCONTINENCE SURGERY  1997   bladder sling  and prlapse correction  . KNEE SURGERY Right 2016   later and medial muscle repair   . TONSILLECTOMY AND ADENOIDECTOMY  age 21  . TRIGGER FINGER RELEASE  2008   x 4 fingers both thums, right pointer and middle finger  . TUBAL LIGATION  1980's     OB History   No obstetric history on file.     Family History  Problem Relation Age of Onset  . Diabetes Mother   . Hypertension Mother   . Osteoporosis Mother   . Bone cancer Maternal Grandfather     Social History   Tobacco Use  . Smoking status: Never Smoker  . Smokeless tobacco: Never Used  Vaping Use  . Vaping Use: Never used  Substance Use Topics  . Alcohol use: No  . Drug use: No    Home Medications Prior to Admission medications   Medication Sig Start Date End Date Taking? Authorizing Provider  acidophilus (RISAQUAD) CAPS capsule Take 1 capsule  by mouth daily.    [provider]  B Complex-C (SUPER B COMPLEX PO) Take 1 tablet by mouth daily.    [provider]  cetirizine (ZYRTEC) 10 MG tablet Take 10 mg by mouth daily as needed for allergies.    [provider]  Cholecalciferol (VITAMIN D-3) 5000 units TABS Take 5,000 Units by mouth daily.    [provider]  losartan (COZAAR) 25 MG tablet Take 25 mg by mouth daily.    [provider]  metoprolol succinate (TOPROL-XL) 50 MG 24 hr tablet Take 75 mg by mouth daily. Take 1.5 tablets daily by mouth. 03/12/16   [provider]  omeprazole (PRILOSEC) 20 MG capsule Take 20 mg by mouth daily before breakfast.    [provider]  oxymetazoline (AFRIN NASAL SPRAY) 0.05 % nasal spray  Place 1 spray into both nostrils 2 (two) times daily. 05/06/19   Varney Biles, MD  Polyethyl Glycol-Propyl Glycol (LUBRICANT EYE DROPS) 0.4-0.3 % SOLN Place 1 drop into both eyes 4 (four) times daily as needed (dry/irritated eyes).    [provider]  traMADol (ULTRAM) 50 MG tablet Take 1 tablet (50 mg total) by mouth every 6 (six) hours as needed (mild pain). Patient not taking: Reported on 05/06/2019 07/05/18   Excell Seltzer, MD    Allergies    Tetracyclines & related, Augmentin [amoxicillin-pot clavulanate], Calcium channel blockers, Codeine, Hydrocodone-acetaminophen, Other, Primidone, and Wellbutrin [bupropion]  Review of Systems   Review of Systems  Constitutional: Negative for chills, diaphoresis and fever.  HENT: Negative for congestion and rhinorrhea.   Respiratory: Negative for cough and shortness of breath.   Cardiovascular: Positive for chest pain. Negative for palpitations.  Gastrointestinal: Negative for diarrhea, nausea and vomiting.  Genitourinary: Negative for difficulty urinating and dysuria.  Musculoskeletal: Negative for arthralgias and back pain.  Skin: Negative for rash and wound.  Neurological: Negative for light-headedness and headaches.    Physical Exam Updated Vital Signs BP (!) 151/77 (BP Location: Right Arm)   Pulse 91   Temp 98.5 F (36.9 C) (Oral)   Resp 12   Ht 5' 3"  (1.6 m)   Wt 112.9 kg   SpO2 97%   BMI 44.11 kg/m   Physical Exam Vitals and nursing note reviewed. Exam conducted with a chaperone present.  Constitutional:      General: She is not in acute distress.    Appearance: Normal appearance.  HENT:     Head: Normocephalic and atraumatic.     Nose: No rhinorrhea.  Eyes:     General:        Right eye: No discharge.        Left eye: No discharge.     Conjunctiva/sclera: Conjunctivae normal.  Cardiovascular:     Rate and Rhythm: Normal rate and regular rhythm.     Heart sounds: Normal heart sounds.  Pulmonary:      Effort: Pulmonary effort is normal. No respiratory distress.     Breath sounds: No stridor. No decreased breath sounds or rales.  Chest:     Chest wall: No edema.  Abdominal:     General: Abdomen is flat. There is no distension.     Palpations: Abdomen is soft.  Musculoskeletal:        General: No tenderness or signs of injury.  Skin:    General: Skin is warm and dry.  Neurological:     General: No focal deficit present.     Mental Status: She is alert.  Mental status is at baseline.     Motor: No weakness.  Psychiatric:        Mood and Affect: Mood normal.        Behavior: Behavior normal.     ED Results / Procedures / Treatments   Labs (all labs ordered are listed, but only abnormal results are displayed) Labs Reviewed  BASIC METABOLIC PANEL - Abnormal; Notable for the following components:      Result Value   Glucose, Bld 204 (*)    All other components within normal limits  CBC - Abnormal; Notable for the following components:   WBC 12.4 (*)    All other components within normal limits  CBG MONITORING, ED - Abnormal; Notable for the following components:   Glucose-Capillary 190 (*)    All other components within normal limits  TROPONIN I (HIGH SENSITIVITY)  TROPONIN I (HIGH SENSITIVITY)    EKG EKG Interpretation  Date/Time:  Friday June 21 2020 18:56:40 EDT Ventricular Rate:  94 PR Interval:  166 QRS Duration: 80 QT Interval:  368 QTC Calculation: 460 R Axis:   19 Text Interpretation: Normal sinus rhythm Normal ECG Confirmed by Dewaine Conger [phone]) on 06/22/2020 8:01:14 AM   Radiology DG Chest 2 View  Result Date: 06/21/2020 CLINICAL DATA:  Chest pain EXAM: CHEST - 2 VIEW COMPARISON:  10/27/2016 FINDINGS: Lungs are well expanded, symmetric, and clear. No pneumothorax or pleural effusion. Cardiac size within normal limits. Pulmonary vascularity is normal. Osseous structures are age-appropriate. No acute bone abnormality. IMPRESSION: No active cardiopulmonary  disease. Electronically Signed   By: Fidela Salisbury MD   On: 06/21/2020 19:30    Procedures Procedures (including critical care time)  Medications Ordered in ED Medications - No data to display  ED Course  I have reviewed the triage vital signs and the nursing notes.  Pertinent labs & imaging results that were available during my care of the patient were reviewed by me and considered in my medical decision making (see chart for details).    MDM Rules/Calculators/A&P                          Chest tightness resolved, 2 troponins negative, hear score 5, long discussion about inpatient versus outpatient work-up, patient has comfort with outpatient plan and that is her preference.  Though she is offered further work-up here to include stress testing.  Vital signs are stable she is afebrile.  Gives no signs of infectious etiology, feels strongly that it was indigestion herself, however was just slightly different than her normal indigestion, she is no risk factor other than age for PE but gives no shortness of breath hypoxia pleuritic type chest pain no leg swelling.  She feels safe for outpatient management is offered to return at any time.  Her other labs are unremarkable. Final Clinical Impression(s) / ED Diagnoses Final diagnoses:  Chest pain, unspecified type    Rx / DC Orders ED Discharge Orders    None       Breck Coons, MD 06/22/20 [phone]

## 2020-06-24 DIAGNOSIS — R0789 Other chest pain: Secondary | ICD-10-CM | POA: Diagnosis not present

## 2020-07-17 DIAGNOSIS — E119 Type 2 diabetes mellitus without complications: Secondary | ICD-10-CM | POA: Diagnosis not present

## 2020-07-17 DIAGNOSIS — Z961 Presence of intraocular lens: Secondary | ICD-10-CM | POA: Diagnosis not present

## 2020-07-17 DIAGNOSIS — H10413 Chronic giant papillary conjunctivitis, bilateral: Secondary | ICD-10-CM | POA: Diagnosis not present

## 2020-07-17 DIAGNOSIS — H43813 Vitreous degeneration, bilateral: Secondary | ICD-10-CM | POA: Diagnosis not present

## 2020-07-31 DIAGNOSIS — F322 Major depressive disorder, single episode, severe without psychotic features: Secondary | ICD-10-CM | POA: Diagnosis not present

## 2020-07-31 DIAGNOSIS — M1611 Unilateral primary osteoarthritis, right hip: Secondary | ICD-10-CM | POA: Diagnosis not present

## 2020-07-31 DIAGNOSIS — Z794 Long term (current) use of insulin: Secondary | ICD-10-CM | POA: Diagnosis not present

## 2020-07-31 DIAGNOSIS — I10 Essential (primary) hypertension: Secondary | ICD-10-CM | POA: Diagnosis not present

## 2020-07-31 DIAGNOSIS — E1159 Type 2 diabetes mellitus with other circulatory complications: Secondary | ICD-10-CM | POA: Diagnosis not present

## 2020-07-31 DIAGNOSIS — K219 Gastro-esophageal reflux disease without esophagitis: Secondary | ICD-10-CM | POA: Diagnosis not present

## 2020-08-21 DIAGNOSIS — M79675 Pain in left toe(s): Secondary | ICD-10-CM | POA: Diagnosis not present

## 2020-09-02 ENCOUNTER — Other Ambulatory Visit: Payer: Self-pay | Admitting: Gastroenterology

## 2020-09-02 DIAGNOSIS — R10812 Left upper quadrant abdominal tenderness: Secondary | ICD-10-CM | POA: Diagnosis not present

## 2020-09-02 DIAGNOSIS — R110 Nausea: Secondary | ICD-10-CM | POA: Diagnosis not present

## 2020-09-02 DIAGNOSIS — R1013 Epigastric pain: Secondary | ICD-10-CM | POA: Diagnosis not present

## 2020-09-03 DIAGNOSIS — Z1231 Encounter for screening mammogram for malignant neoplasm of breast: Secondary | ICD-10-CM | POA: Diagnosis not present

## 2020-09-03 DIAGNOSIS — Z124 Encounter for screening for malignant neoplasm of cervix: Secondary | ICD-10-CM | POA: Diagnosis not present

## 2020-09-03 DIAGNOSIS — Z01419 Encounter for gynecological examination (general) (routine) without abnormal findings: Secondary | ICD-10-CM | POA: Diagnosis not present

## 2020-09-12 ENCOUNTER — Encounter (HOSPITAL_COMMUNITY): Payer: Self-pay | Admitting: Gastroenterology

## 2020-09-20 ENCOUNTER — Other Ambulatory Visit (HOSPITAL_COMMUNITY)
Admission: RE | Admit: 2020-09-20 | Discharge: 2020-09-20 | Disposition: A | Discharge status: Home / Self Care | Payer: PPO | Source: Ambulatory Visit | Attending: Gastroenterology | Admitting: Gastroenterology

## 2020-09-20 DIAGNOSIS — Z01812 Encounter for preprocedural laboratory examination: Secondary | ICD-10-CM | POA: Diagnosis not present

## 2020-09-20 DIAGNOSIS — Z20822 Contact with and (suspected) exposure to covid-19: Secondary | ICD-10-CM | POA: Diagnosis not present

## 2020-09-20 LAB — SARS CORONAVIRUS 2 (TAT 6-24 HRS): SARS Coronavirus 2: NEGATIVE

## 2020-09-23 NOTE — Anesthesia Preprocedure Evaluation (Addendum)
Anesthesia Evaluation  Patient identified by MRN, date of birth, ID band Patient awake    Reviewed: Allergy & Precautions, NPO status , Patient's Chart, lab work & pertinent test results  History of Anesthesia Complications Negative for: history of anesthetic complications  Airway Mallampati: II  TM Distance: >3 FB Neck ROM: Full    Dental no notable dental hx. (+) Teeth Intact, Dental Advisory Given, Caps,    Pulmonary sleep apnea (noncompliant with CPAP) ,    Pulmonary exam normal breath sounds clear to auscultation       Cardiovascular hypertension, Pt. on medications and Pt. on home beta blockers Normal cardiovascular exam Rhythm:Regular Rate:Normal     Neuro/Psych neg Headaches, Depression    GI/Hepatic Neg liver ROS, GERD  Medicated and Controlled, Cholecystitis    Endo/Other  diabetesMorbid obesity Pre-DM   Renal/GU negative Renal ROS  negative genitourinary   Musculoskeletal negative musculoskeletal ROS (+)   Abdominal (+) + obese,   Peds  Hematology  (+) REFUSES BLOOD PRODUCTS, JEHOVAH'S WITNESS  Anesthesia Other Findings   Reproductive/Obstetrics                            Anesthesia Physical  Anesthesia Plan  ASA: III  Anesthesia Plan: MAC   Post-op Pain Management:    Induction: Intravenous  PONV Risk Score and Plan: 4 or greater and Midazolam  Airway Management Planned: Nasal Cannula, Natural Airway and Simple Face Mask  Additional Equipment: None  Intra-op Plan:   Post-operative Plan: Extubation in OR  Informed Consent: I have reviewed the patients History and Physical, chart, labs and discussed the procedure including the risks, benefits and alternatives for the proposed anesthesia with the patient or authorized representative who has indicated his/her understanding and acceptance.     Dental advisory given  Plan Discussed with: CRNA and  Anesthesiologist  Anesthesia Plan Comments:         Anesthesia Quick Evaluation

## 2020-09-24 ENCOUNTER — Other Ambulatory Visit: Payer: Self-pay

## 2020-09-24 ENCOUNTER — Encounter (HOSPITAL_COMMUNITY): Payer: Self-pay | Admitting: Gastroenterology

## 2020-09-24 ENCOUNTER — Encounter (HOSPITAL_COMMUNITY)
Admission: RE | Disposition: A | Discharge status: Home / Self Care | Payer: Self-pay | Source: Home / Self Care | Attending: Gastroenterology

## 2020-09-24 ENCOUNTER — Ambulatory Visit (HOSPITAL_COMMUNITY): Payer: PPO | Admitting: Anesthesiology

## 2020-09-24 ENCOUNTER — Ambulatory Visit (HOSPITAL_COMMUNITY)
Admission: RE | Admit: 2020-09-24 | Discharge: 2020-09-24 | Disposition: A | Discharge status: Home / Self Care | Payer: PPO | Attending: Gastroenterology | Admitting: Gastroenterology

## 2020-09-24 DIAGNOSIS — K219 Gastro-esophageal reflux disease without esophagitis: Secondary | ICD-10-CM | POA: Diagnosis not present

## 2020-09-24 DIAGNOSIS — K317 Polyp of stomach and duodenum: Secondary | ICD-10-CM | POA: Insufficient documentation

## 2020-09-24 DIAGNOSIS — K449 Diaphragmatic hernia without obstruction or gangrene: Secondary | ICD-10-CM | POA: Diagnosis not present

## 2020-09-24 DIAGNOSIS — R110 Nausea: Secondary | ICD-10-CM | POA: Insufficient documentation

## 2020-09-24 DIAGNOSIS — Z6841 Body Mass Index (BMI) 40.0 and over, adult: Secondary | ICD-10-CM | POA: Diagnosis not present

## 2020-09-24 DIAGNOSIS — R0789 Other chest pain: Secondary | ICD-10-CM | POA: Insufficient documentation

## 2020-09-24 DIAGNOSIS — K319 Disease of stomach and duodenum, unspecified: Secondary | ICD-10-CM | POA: Insufficient documentation

## 2020-09-24 DIAGNOSIS — R634 Abnormal weight loss: Secondary | ICD-10-CM | POA: Insufficient documentation

## 2020-09-24 DIAGNOSIS — E669 Obesity, unspecified: Secondary | ICD-10-CM | POA: Insufficient documentation

## 2020-09-24 DIAGNOSIS — R12 Heartburn: Secondary | ICD-10-CM | POA: Diagnosis not present

## 2020-09-24 DIAGNOSIS — K3189 Other diseases of stomach and duodenum: Secondary | ICD-10-CM | POA: Diagnosis not present

## 2020-09-24 DIAGNOSIS — R1013 Epigastric pain: Secondary | ICD-10-CM | POA: Diagnosis not present

## 2020-09-24 HISTORY — PX: ESOPHAGOGASTRODUODENOSCOPY (EGD) WITH PROPOFOL: SHX5813

## 2020-09-24 HISTORY — DX: Type 2 diabetes mellitus without complications: E11.9

## 2020-09-24 HISTORY — PX: BIOPSY: SHX5522

## 2020-09-24 LAB — GLUCOSE, CAPILLARY
Glucose-Capillary: 126 mg/dL — ABNORMAL HIGH (ref 70–99)
Glucose-Capillary: 101 mg/dL — ABNORMAL HIGH (ref 70–99)

## 2020-09-24 SURGERY — ESOPHAGOGASTRODUODENOSCOPY (EGD) WITH PROPOFOL
Anesthesia: Monitor Anesthesia Care

## 2020-09-24 MED ORDER — PROPOFOL 500 MG/50ML IV EMUL
INTRAVENOUS | Status: AC
  Filled 2020-09-24: qty 50

## 2020-09-24 MED ORDER — LIDOCAINE 2% (20 MG/ML) 5 ML SYRINGE
INTRAMUSCULAR | Status: DC | PRN
  Administered 2020-09-24: 100 mg via INTRAVENOUS

## 2020-09-24 MED ORDER — LACTATED RINGERS IV SOLN: INTRAVENOUS | Status: DC | PRN

## 2020-09-24 MED ORDER — PROPOFOL 1000 MG/100ML IV EMUL
INTRAVENOUS | Status: AC
  Filled 2020-09-24: qty 100

## 2020-09-24 MED ORDER — PROPOFOL 500 MG/50ML IV EMUL
INTRAVENOUS | Status: DC | PRN
  Administered 2020-09-24: 125 ug/kg/min via INTRAVENOUS

## 2020-09-24 MED ORDER — AFRIN NASAL SPRAY 0.05 % NA SOLN: 1.0000 | Freq: Two times a day (BID) | NASAL | 0 refills | Status: AC

## 2020-09-24 SURGICAL SUPPLY — 15 items

## 2020-09-24 NOTE — Anesthesia Postprocedure Evaluation (Signed)
Anesthesia Post Note  Patient: Heidi Byrd  Procedure(s) Performed: ESOPHAGOGASTRODUODENOSCOPY (EGD) WITH PROPOFOL (N/A ) BIOPSY     Patient location during evaluation: PACU Anesthesia Type: MAC Level of consciousness: awake and alert Pain management: pain level controlled Vital Signs Assessment: post-procedure vital signs reviewed and stable Respiratory status: spontaneous breathing, nonlabored ventilation, respiratory function stable and patient connected to nasal cannula oxygen Cardiovascular status: stable and blood pressure returned to baseline Postop Assessment: no apparent nausea or vomiting Anesthetic complications: no   No complications documented.  Last Vitals:  Vitals:   09/24/20 0910 09/24/20 0920  BP: 106/79 118/65  Pulse: 60 64  Resp: 18 18  Temp:    SpO2: 95% 95%    Last Pain:  Vitals:   09/24/20 0920  TempSrc:   PainSc: 0-No pain                 Mayjor Ager

## 2020-09-24 NOTE — H&P (Signed)
Heidi Byrd is an 74 y.o. female.   Chief Complaint: noncardiac chest pain HPI: Heidi Byrd is a very pleasant 74 year old Jehovah's Witness (no blood products) with an episode of noncardiac chest pain prompting emergency room for evaluation several months ago.  When seen by me in the office 3 weeks ago, she was having significant upper GI tract symptoms with epigastric burning and discomfort, and nausea.  She is known to have a moderately large hiatal hernia based on endoscopy about 5 years ago.  At the time of her recent office visit, I intensified her antipeptic therapy to pantoprazole 40 mg twice daily, and on that regimen, she is substantially better, with resolution of the epigastric discomfort and nausea, although she still has some degree of retrosternal burning.    Past Medical History:  Diagnosis Date  . Anxiety   . Cholelithiasis with chronic cholecystitis 07/04/2018  . Cough    due to nausea vomiting no fever had cough since sunday 06-19-18  . Depression   . Diabetes mellitus without complication ([place])    type 2   . Dry eyes   . Dry mouth   . Essential tremor 04/15/2016  . Fatty liver   . GERD (gastroesophageal reflux disease)   . Headache    hx of migraines prior to menopause, tension headaches now  . Hemorrhoids   . Hyperglycemia   . Hypertension   . Migraine headache   . Obesity   . Precordial pain 09/14/2013  . Prediabetes   . Right medial knee pain 06/14/2014  . Seasonal allergic reaction   . Sleep apnea    no cpap used  . Vitamin D deficiency     Past Surgical History:  Procedure Laterality Date  . CARPAL TUNNEL RELEASE Bilateral 1990's  . childbirth  1973, 1974   hemorrahe after one childbirth, no blood given  . CHOLECYSTECTOMY N/A 07/04/2018   Procedure: LAPAROSCOPIC CHOLECYSTECTOMY WITH INTRAOPERATIVE CHOLANGIOGRAM;  Surgeon: Excell Seltzer, MD;  Location: [facility];  Service: General;  Laterality: N/A;  . colonscopy  8 yrs ago   with benign polyps  . EYE  SURGERY Bilateral 2016   ioc lens implant for catracts , both  eyes laser surgery for film removal  . INCONTINENCE SURGERY  1997   bladder sling  and prlapse correction  . KNEE SURGERY Right 2016   later and medial muscle repair   . TONSILLECTOMY AND ADENOIDECTOMY  age 53  . TRIGGER FINGER RELEASE  2008   x 4 fingers both thums, right pointer and middle finger  . TUBAL LIGATION  1980's    Family History  Problem Relation Age of Onset  . Diabetes Mother   . Hypertension Mother   . Osteoporosis Mother   . Bone cancer Maternal Grandfather    Social History:  reports that she has never smoked. She has never used smokeless tobacco. She reports that she does not drink alcohol and does not use drugs.  Allergies:  Allergies  Allergen Reactions  . Tetracyclines & Related Shortness Of Breath  . Augmentin [Amoxicillin-Pot Clavulanate] Nausea And Vomiting    Has patient had a PCN reaction causing immediate rash, facial/tongue/throat swelling, SOB or lightheadedness with hypotension: No Has patient had a PCN reaction causing severe rash involving mucus membranes or skin necrosis: No Has patient had a PCN reaction that required hospitalization: No Has patient had a PCN reaction occurring within the last 10 years:No If all of the above answers are "NO", then may proceed with Cephalosporin use. "  stomach uspet"   . Calcium Channel Blockers Other (See Comments)    abd cramps with propanolol  . Codeine Nausea Only  . Hydrocodone-Acetaminophen     DYSPNEA  . Other     NO BLOOD PRODUCTS  . Primidone Other (See Comments)    numnbess and vivid nightmares   . Wellbutrin [Bupropion] Other (See Comments)    wellbutrin xl =paresias    Medications Prior to Admission  Medication Sig Dispense Refill  . B Complex-C (SUPER B COMPLEX PO) Take 1 tablet by mouth daily.    . Cholecalciferol (VITAMIN D-3) 5000 units TABS Take 5,000 Units by mouth daily.    Marland Kitchen losartan (COZAAR) 50 MG tablet Take 50 mg by  mouth daily.    Marland Kitchen lovastatin (MEVACOR) 10 MG tablet Take 10 mg by mouth every evening.    . metFORMIN (GLUCOPHAGE-XR) 500 MG 24 hr tablet Take 1,000 mg by mouth at bedtime.     . metoprolol succinate (TOPROL-XL) 50 MG 24 hr tablet Take 75 mg by mouth daily.   2  . pantoprazole (PROTONIX) 40 MG tablet Take 40 mg by mouth 2 (two) times daily before a meal.     . PARoxetine (PAXIL) 40 MG tablet Take 40 mg by mouth daily.    Vladimir Faster Glycol-Propyl Glycol (LUBRICANT EYE DROPS) 0.4-0.3 % SOLN Place 1 drop into both eyes 4 (four) times daily as needed (dry/irritated eyes).    . Probiotic Product (PROBIOTIC PO) Take 1 capsule by mouth daily.    . cetirizine (ZYRTEC) 10 MG tablet Take 10 mg by mouth daily as needed for allergies.    Marland Kitchen ondansetron (ZOFRAN ODT) 4 MG disintegrating tablet Take 1 tablet (4 mg total) by mouth every 8 (eight) hours as needed for up to 10 doses for nausea or vomiting. (Patient not taking: Reported on 09/17/2020) 10 tablet 0  . oxymetazoline (AFRIN NASAL SPRAY) 0.05 % nasal spray Place 1 spray into both nostrils 2 (two) times daily. (Patient taking differently: Place 1 spray into both nostrils 2 (two) times daily as needed (bloody nose). ) 14.7 mL 0    Results for orders placed or performed during the hospital encounter of 09/24/20 (from the past 48 hour(s))  Glucose, capillary     Status: Abnormal   Collection Time: 09/24/20  7:54 AM  Result Value Ref Range   Glucose-Capillary 126 (H) 70 - 99 mg/dL    Comment: Glucose reference range applies only to samples taken after fasting for at least 8 hours.   No results found.  Review of Systems the patient reports a 15 pound weight loss, unintentional, which she attributes to the fact that her stomach symptoms, including nausea, made it difficult for her to eat normally and they took away her appetite.  Blood pressure (!) 161/69, temperature 98.1 F (36.7 C), temperature source Temporal, resp. rate 15, height 5' 3"  (1.6 m),  weight 105.7 kg, SpO2 97 %.   Physical Exam substantially overweight, but pleasant Caucasian female, looking younger than her age, no distress.  Cognitively intact, no overt focal neurologic deficits.  Chest clear.  Heart with normal rhythm and no murmur.  Abdomen is very adipose but without mass-effect or tenderness.  Assessment/Plan Proceed endoscopic evaluation here at the hospital in view of her high BMI.  I will be looking for enlargement of her hiatal hernia or the interim development of neoplasia (doubt), as well as assessing for any reflux related mucosal abnormalities.  Cleotis Nipper, MD 09/24/2020, 8:10 AM

## 2020-09-24 NOTE — Transfer of Care (Signed)
Immediate Anesthesia Transfer of Care Note  Patient: Heidi Byrd  Procedure(s) Performed: ESOPHAGOGASTRODUODENOSCOPY (EGD) WITH PROPOFOL (N/A ) BIOPSY  Patient Location: PACU and Endoscopy Unit  Anesthesia Type:MAC  Level of Consciousness: awake, alert  and oriented  Airway & Oxygen Therapy: Patient Spontanous Breathing  Post-op Assessment: Report given to RN and Post -op Vital signs reviewed and stable  Post vital signs: Reviewed and stable  Last Vitals:  Vitals Value Taken Time  BP    Temp    Pulse    Resp    SpO2      Last Pain:  Vitals:   09/24/20 0743  TempSrc: Temporal  PainSc: 0-No pain         Complications: No complications documented.

## 2020-09-24 NOTE — Discharge Instructions (Signed)
YOU HAD AN ENDOSCOPIC PROCEDURE TODAY: Refer to the procedure report and other information in the discharge instructions given to you for any specific questions about what was found during the examination. If this information does not answer your questions, please call [facility] office at [phone] to clarify.   YOU SHOULD EXPECT: Some feelings of bloating in the abdomen. Passage of more gas than usual. Walking can help get rid of the air that was put into your GI tract during the procedure and reduce the bloating. If you had a lower endoscopy (such as a colonoscopy or flexible sigmoidoscopy) you may notice spotting of blood in your stool or on the toilet paper. Some abdominal soreness may be present for a day or two, also.  DIET: Your first meal following the procedure should be a light meal and then it is ok to progress to your normal diet. A half-sandwich or bowl of soup is an example of a good first meal. Heavy or fried foods are harder to digest and may make you feel nauseous or bloated. Drink plenty of fluids but you should avoid alcoholic beverages for 24 hours. If you had a esophageal dilation, please see attached instructions for diet.    ACTIVITY: Your care partner should take you home directly after the procedure. You should plan to take it easy, moving slowly for the rest of the day. You can resume normal activity the day after the procedure however YOU SHOULD NOT DRIVE, use power tools, machinery or perform tasks that involve climbing or major physical exertion for 24 hours (because of the sedation medicines used during the test).   SYMPTOMS TO REPORT IMMEDIATELY: A gastroenterologist can be reached at any hour. Please call [phone]  for any of the following symptoms:   Following upper endoscopy (EGD, EUS, ERCP, esophageal dilation) Vomiting of blood or coffee ground material  New, significant abdominal pain  New, significant chest pain or pain under the shoulder blades  Painful or  persistently difficult swallowing  New shortness of breath  Black, tarry-looking or red, bloody stools  FOLLOW UP:  If any biopsies were taken you will be contacted by phone or by letter within the next 1-3 weeks. Call [phone]  if you have not heard about the biopsies in 3 weeks.  Please also call with any specific questions about appointments or follow up tests.

## 2020-09-24 NOTE — Op Note (Signed)
[facility] Patient Name: Heidi Byrd Procedure Date: 09/24/2020 MRN: [id number] Attending MD: Ronald Lobo , MD Date of Birth: 27-May-1946 CSN: [id number] Age: 74 Admit Type: Outpatient Procedure:                Upper GI endoscopy Indications:              Heartburn, Chest pain (non cardiac), Nausea, Weight                            loss Providers:                Ronald Lobo, MD, Benay Pillow, RN, Cherylynn Ridges, Technician, Stephanie [place] CRNA Referring MD:              Medicines:                Monitored Anesthesia Care Complications:            No immediate complications. Estimated Blood Loss:     Estimated blood loss was minimal. Procedure:                Pre-Anesthesia Assessment:                           - Prior to the procedure, a History and Physical                            was performed, and patient medications and                            allergies were reviewed. The patient's tolerance of                            previous anesthesia was also reviewed. The risks                            and benefits of the procedure and the sedation                            options and risks were discussed with the patient.                            All questions were answered, and informed consent                            was obtained. Prior Anticoagulants: The patient has                            taken no previous anticoagulant or antiplatelet                            agents. ASA Grade Assessment: III - A patient with  severe systemic disease. After reviewing the risks                            and benefits, the patient was deemed in                            satisfactory condition to undergo the procedure.                           After obtaining informed consent, the endoscope was                            passed under direct vision. Throughout the                             procedure, the patient's blood pressure, pulse, and                            oxygen saturations were monitored continuously. The                            GIF-H190 ([id number]) Olympus gastroscope was                            introduced through the mouth, and advanced to the                            second part of duodenum. The upper GI endoscopy was                            accomplished without difficulty. The patient                            tolerated the procedure well. Scope In: Scope Out: Findings:      The larynx was normal.      A 2 cm hiatal hernia was present.      The exam of the esophagus was otherwise normal.      There is no endoscopic evidence of Barrett's esophagus, esophagitis,       stricture or mass in the entire esophagus.      Bilious fluid was found in the gastric fundus.      A few 4 to 8 mm semi-sessile polyps were found in the gastric fundus.       Biopsies were taken with a cold forceps for histology.      The exam of the stomach was otherwise normal.      The cardia and gastric fundus were normal on retroflexion.      Biopsies were taken with a cold forceps in the gastric antrum for       histology. Estimated blood loss was minimal.      The examined duodenum was normal. Impression:               - Normal larynx.                           - 2 cm  hiatal hernia.                           - Bilious gastric fluid.                           - A few gastric polyps. Biopsied.                           - Normal examined duodenum.                           - Biopsies were taken with a cold forceps for                            histology in the gastric antrum. Moderate Sedation:      This patient was sedated with monitored anesthesia care, not moderate       sedation. The procedure was done as an outpatient at the hospital       because of BMI of 44. Recommendation:           - Continue present medications.                           - Await pathology  results.                           - Return to my office as previously scheduled. Procedure Code(s):        --- Professional ---                           [phone], Esophagogastroduodenoscopy, flexible,                            transoral; with biopsy, single or multiple Diagnosis Code(s):        --- Professional ---                           K31.7, Polyp of stomach and duodenum                           R12, Heartburn                           R07.89, Other chest pain                           R11.0, Nausea CPT copyright 2019 American Medical Association. All rights reserved. The codes documented in this report are preliminary and upon coder review may  be revised to meet current compliance requirements. Ronald Lobo, MD 09/24/2020 8:55:16 AM This report has been signed electronically. Number of Addenda: 0

## 2020-09-25 ENCOUNTER — Encounter (HOSPITAL_COMMUNITY): Payer: Self-pay | Admitting: Gastroenterology

## 2020-09-25 ENCOUNTER — Other Ambulatory Visit: Payer: Self-pay

## 2020-09-26 LAB — SURGICAL PATHOLOGY

## 2020-10-18 DIAGNOSIS — R1013 Epigastric pain: Secondary | ICD-10-CM | POA: Diagnosis not present

## 2020-10-18 DIAGNOSIS — K219 Gastro-esophageal reflux disease without esophagitis: Secondary | ICD-10-CM | POA: Diagnosis not present

## 2020-10-18 DIAGNOSIS — K2960 Other gastritis without bleeding: Secondary | ICD-10-CM | POA: Diagnosis not present

## 2020-10-18 DIAGNOSIS — R0789 Other chest pain: Secondary | ICD-10-CM | POA: Diagnosis not present

## 2020-11-04 DIAGNOSIS — E1159 Type 2 diabetes mellitus with other circulatory complications: Secondary | ICD-10-CM | POA: Diagnosis not present

## 2020-11-04 DIAGNOSIS — Z7984 Long term (current) use of oral hypoglycemic drugs: Secondary | ICD-10-CM | POA: Diagnosis not present

## 2020-12-09 DIAGNOSIS — K219 Gastro-esophageal reflux disease without esophagitis: Secondary | ICD-10-CM | POA: Diagnosis not present

## 2020-12-09 DIAGNOSIS — E1159 Type 2 diabetes mellitus with other circulatory complications: Secondary | ICD-10-CM | POA: Diagnosis not present

## 2020-12-09 DIAGNOSIS — F322 Major depressive disorder, single episode, severe without psychotic features: Secondary | ICD-10-CM | POA: Diagnosis not present

## 2020-12-09 DIAGNOSIS — M1611 Unilateral primary osteoarthritis, right hip: Secondary | ICD-10-CM | POA: Diagnosis not present

## 2020-12-09 DIAGNOSIS — I10 Essential (primary) hypertension: Secondary | ICD-10-CM | POA: Diagnosis not present

## 2021-01-05 DIAGNOSIS — I10 Essential (primary) hypertension: Secondary | ICD-10-CM | POA: Diagnosis not present

## 2021-01-05 DIAGNOSIS — E1159 Type 2 diabetes mellitus with other circulatory complications: Secondary | ICD-10-CM | POA: Diagnosis not present

## 2021-01-05 DIAGNOSIS — F322 Major depressive disorder, single episode, severe without psychotic features: Secondary | ICD-10-CM | POA: Diagnosis not present

## 2021-01-05 DIAGNOSIS — M1611 Unilateral primary osteoarthritis, right hip: Secondary | ICD-10-CM | POA: Diagnosis not present

## 2021-01-05 DIAGNOSIS — K219 Gastro-esophageal reflux disease without esophagitis: Secondary | ICD-10-CM | POA: Diagnosis not present

## 2021-01-29 DIAGNOSIS — K219 Gastro-esophageal reflux disease without esophagitis: Secondary | ICD-10-CM | POA: Diagnosis not present

## 2021-01-29 DIAGNOSIS — M1611 Unilateral primary osteoarthritis, right hip: Secondary | ICD-10-CM | POA: Diagnosis not present

## 2021-01-29 DIAGNOSIS — E1159 Type 2 diabetes mellitus with other circulatory complications: Secondary | ICD-10-CM | POA: Diagnosis not present

## 2021-01-29 DIAGNOSIS — Z79899 Other long term (current) drug therapy: Secondary | ICD-10-CM | POA: Diagnosis not present

## 2021-01-29 DIAGNOSIS — Z7984 Long term (current) use of oral hypoglycemic drugs: Secondary | ICD-10-CM | POA: Diagnosis not present

## 2021-01-29 DIAGNOSIS — I10 Essential (primary) hypertension: Secondary | ICD-10-CM | POA: Diagnosis not present

## 2021-01-29 DIAGNOSIS — F322 Major depressive disorder, single episode, severe without psychotic features: Secondary | ICD-10-CM | POA: Diagnosis not present

## 2021-02-05 DIAGNOSIS — M1611 Unilateral primary osteoarthritis, right hip: Secondary | ICD-10-CM | POA: Diagnosis not present

## 2021-02-05 DIAGNOSIS — F322 Major depressive disorder, single episode, severe without psychotic features: Secondary | ICD-10-CM | POA: Diagnosis not present

## 2021-02-05 DIAGNOSIS — K219 Gastro-esophageal reflux disease without esophagitis: Secondary | ICD-10-CM | POA: Diagnosis not present

## 2021-02-05 DIAGNOSIS — E1159 Type 2 diabetes mellitus with other circulatory complications: Secondary | ICD-10-CM | POA: Diagnosis not present

## 2021-02-05 DIAGNOSIS — I10 Essential (primary) hypertension: Secondary | ICD-10-CM | POA: Diagnosis not present

## 2021-02-19 DIAGNOSIS — R112 Nausea with vomiting, unspecified: Secondary | ICD-10-CM | POA: Diagnosis not present

## 2021-02-19 DIAGNOSIS — K2960 Other gastritis without bleeding: Secondary | ICD-10-CM | POA: Diagnosis not present

## 2021-02-19 DIAGNOSIS — K219 Gastro-esophageal reflux disease without esophagitis: Secondary | ICD-10-CM | POA: Diagnosis not present

## 2021-03-03 DIAGNOSIS — E1159 Type 2 diabetes mellitus with other circulatory complications: Secondary | ICD-10-CM | POA: Diagnosis not present

## 2021-03-03 DIAGNOSIS — M1611 Unilateral primary osteoarthritis, right hip: Secondary | ICD-10-CM | POA: Diagnosis not present

## 2021-03-03 DIAGNOSIS — F322 Major depressive disorder, single episode, severe without psychotic features: Secondary | ICD-10-CM | POA: Diagnosis not present

## 2021-03-03 DIAGNOSIS — K219 Gastro-esophageal reflux disease without esophagitis: Secondary | ICD-10-CM | POA: Diagnosis not present

## 2021-03-03 DIAGNOSIS — I10 Essential (primary) hypertension: Secondary | ICD-10-CM | POA: Diagnosis not present

## 2021-03-13 DIAGNOSIS — R1012 Left upper quadrant pain: Secondary | ICD-10-CM | POA: Diagnosis not present

## 2021-03-13 DIAGNOSIS — K449 Diaphragmatic hernia without obstruction or gangrene: Secondary | ICD-10-CM | POA: Diagnosis not present

## 2021-03-13 DIAGNOSIS — R6881 Early satiety: Secondary | ICD-10-CM | POA: Diagnosis not present

## 2021-03-13 DIAGNOSIS — R112 Nausea with vomiting, unspecified: Secondary | ICD-10-CM | POA: Diagnosis not present

## 2021-03-13 DIAGNOSIS — K219 Gastro-esophageal reflux disease without esophagitis: Secondary | ICD-10-CM | POA: Diagnosis not present

## 2021-03-19 DIAGNOSIS — K219 Gastro-esophageal reflux disease without esophagitis: Secondary | ICD-10-CM | POA: Diagnosis not present

## 2021-03-19 DIAGNOSIS — R1114 Bilious vomiting: Secondary | ICD-10-CM | POA: Diagnosis not present

## 2021-03-26 DIAGNOSIS — I10 Essential (primary) hypertension: Secondary | ICD-10-CM | POA: Diagnosis not present

## 2021-03-26 DIAGNOSIS — F322 Major depressive disorder, single episode, severe without psychotic features: Secondary | ICD-10-CM | POA: Diagnosis not present

## 2021-03-26 DIAGNOSIS — K219 Gastro-esophageal reflux disease without esophagitis: Secondary | ICD-10-CM | POA: Diagnosis not present

## 2021-03-26 DIAGNOSIS — E1159 Type 2 diabetes mellitus with other circulatory complications: Secondary | ICD-10-CM | POA: Diagnosis not present

## 2021-03-26 DIAGNOSIS — M1611 Unilateral primary osteoarthritis, right hip: Secondary | ICD-10-CM | POA: Diagnosis not present

## 2021-04-08 DIAGNOSIS — N760 Acute vaginitis: Secondary | ICD-10-CM | POA: Diagnosis not present

## 2021-04-08 DIAGNOSIS — N762 Acute vulvitis: Secondary | ICD-10-CM | POA: Diagnosis not present

## 2021-04-29 DIAGNOSIS — K219 Gastro-esophageal reflux disease without esophagitis: Secondary | ICD-10-CM | POA: Diagnosis not present

## 2021-04-29 DIAGNOSIS — M1611 Unilateral primary osteoarthritis, right hip: Secondary | ICD-10-CM | POA: Diagnosis not present

## 2021-04-29 DIAGNOSIS — I10 Essential (primary) hypertension: Secondary | ICD-10-CM | POA: Diagnosis not present

## 2021-04-29 DIAGNOSIS — E1159 Type 2 diabetes mellitus with other circulatory complications: Secondary | ICD-10-CM | POA: Diagnosis not present

## 2021-04-29 DIAGNOSIS — F322 Major depressive disorder, single episode, severe without psychotic features: Secondary | ICD-10-CM | POA: Diagnosis not present

## 2021-06-02 DIAGNOSIS — K219 Gastro-esophageal reflux disease without esophagitis: Secondary | ICD-10-CM | POA: Diagnosis not present

## 2021-06-02 DIAGNOSIS — K2960 Other gastritis without bleeding: Secondary | ICD-10-CM | POA: Diagnosis not present

## 2021-06-02 DIAGNOSIS — R112 Nausea with vomiting, unspecified: Secondary | ICD-10-CM | POA: Diagnosis not present

## 2021-06-05 DIAGNOSIS — E1159 Type 2 diabetes mellitus with other circulatory complications: Secondary | ICD-10-CM | POA: Diagnosis not present

## 2021-06-08 DIAGNOSIS — F322 Major depressive disorder, single episode, severe without psychotic features: Secondary | ICD-10-CM | POA: Diagnosis not present

## 2021-06-08 DIAGNOSIS — I10 Essential (primary) hypertension: Secondary | ICD-10-CM | POA: Diagnosis not present

## 2021-06-08 DIAGNOSIS — M1611 Unilateral primary osteoarthritis, right hip: Secondary | ICD-10-CM | POA: Diagnosis not present

## 2021-06-08 DIAGNOSIS — E1159 Type 2 diabetes mellitus with other circulatory complications: Secondary | ICD-10-CM | POA: Diagnosis not present

## 2021-06-08 DIAGNOSIS — K219 Gastro-esophageal reflux disease without esophagitis: Secondary | ICD-10-CM | POA: Diagnosis not present

## 2021-06-16 DIAGNOSIS — E1159 Type 2 diabetes mellitus with other circulatory complications: Secondary | ICD-10-CM | POA: Diagnosis not present

## 2021-06-16 DIAGNOSIS — I10 Essential (primary) hypertension: Secondary | ICD-10-CM | POA: Diagnosis not present

## 2021-07-24 DIAGNOSIS — E1159 Type 2 diabetes mellitus with other circulatory complications: Secondary | ICD-10-CM | POA: Diagnosis not present

## 2021-07-24 DIAGNOSIS — N762 Acute vulvitis: Secondary | ICD-10-CM | POA: Diagnosis not present

## 2021-07-24 DIAGNOSIS — I10 Essential (primary) hypertension: Secondary | ICD-10-CM | POA: Diagnosis not present

## 2021-07-24 DIAGNOSIS — M1611 Unilateral primary osteoarthritis, right hip: Secondary | ICD-10-CM | POA: Diagnosis not present

## 2021-07-24 DIAGNOSIS — F322 Major depressive disorder, single episode, severe without psychotic features: Secondary | ICD-10-CM | POA: Diagnosis not present

## 2021-07-24 DIAGNOSIS — K219 Gastro-esophageal reflux disease without esophagitis: Secondary | ICD-10-CM | POA: Diagnosis not present

## 2021-08-25 DIAGNOSIS — F322 Major depressive disorder, single episode, severe without psychotic features: Secondary | ICD-10-CM | POA: Diagnosis not present

## 2021-08-25 DIAGNOSIS — E1159 Type 2 diabetes mellitus with other circulatory complications: Secondary | ICD-10-CM | POA: Diagnosis not present

## 2021-08-25 DIAGNOSIS — K219 Gastro-esophageal reflux disease without esophagitis: Secondary | ICD-10-CM | POA: Diagnosis not present

## 2021-08-25 DIAGNOSIS — M1611 Unilateral primary osteoarthritis, right hip: Secondary | ICD-10-CM | POA: Diagnosis not present

## 2021-08-25 DIAGNOSIS — I10 Essential (primary) hypertension: Secondary | ICD-10-CM | POA: Diagnosis not present

## 2021-09-02 DIAGNOSIS — E119 Type 2 diabetes mellitus without complications: Secondary | ICD-10-CM | POA: Diagnosis not present

## 2021-09-02 DIAGNOSIS — Z961 Presence of intraocular lens: Secondary | ICD-10-CM | POA: Diagnosis not present

## 2021-09-02 DIAGNOSIS — H43811 Vitreous degeneration, right eye: Secondary | ICD-10-CM | POA: Diagnosis not present

## 2021-09-02 DIAGNOSIS — D3132 Benign neoplasm of left choroid: Secondary | ICD-10-CM | POA: Diagnosis not present

## 2021-09-22 DIAGNOSIS — Z1211 Encounter for screening for malignant neoplasm of colon: Secondary | ICD-10-CM | POA: Diagnosis not present

## 2021-09-22 DIAGNOSIS — Z0000 Encounter for general adult medical examination without abnormal findings: Secondary | ICD-10-CM | POA: Diagnosis not present

## 2021-09-22 DIAGNOSIS — E1159 Type 2 diabetes mellitus with other circulatory complications: Secondary | ICD-10-CM | POA: Diagnosis not present

## 2021-09-22 DIAGNOSIS — I10 Essential (primary) hypertension: Secondary | ICD-10-CM | POA: Diagnosis not present

## 2021-09-22 DIAGNOSIS — Z7984 Long term (current) use of oral hypoglycemic drugs: Secondary | ICD-10-CM | POA: Diagnosis not present

## 2021-09-25 DIAGNOSIS — Z1231 Encounter for screening mammogram for malignant neoplasm of breast: Secondary | ICD-10-CM | POA: Diagnosis not present

## 2021-10-07 DIAGNOSIS — M1611 Unilateral primary osteoarthritis, right hip: Secondary | ICD-10-CM | POA: Diagnosis not present

## 2021-10-07 DIAGNOSIS — F322 Major depressive disorder, single episode, severe without psychotic features: Secondary | ICD-10-CM | POA: Diagnosis not present

## 2021-10-07 DIAGNOSIS — E1159 Type 2 diabetes mellitus with other circulatory complications: Secondary | ICD-10-CM | POA: Diagnosis not present

## 2021-10-07 DIAGNOSIS — K219 Gastro-esophageal reflux disease without esophagitis: Secondary | ICD-10-CM | POA: Diagnosis not present

## 2021-10-07 DIAGNOSIS — I10 Essential (primary) hypertension: Secondary | ICD-10-CM | POA: Diagnosis not present

## 2021-10-26 DIAGNOSIS — R0981 Nasal congestion: Secondary | ICD-10-CM | POA: Diagnosis not present

## 2021-10-26 DIAGNOSIS — R051 Acute cough: Secondary | ICD-10-CM | POA: Diagnosis not present

## 2021-10-26 DIAGNOSIS — U071 COVID-19: Secondary | ICD-10-CM | POA: Diagnosis not present

## 2021-11-07 DIAGNOSIS — E1159 Type 2 diabetes mellitus with other circulatory complications: Secondary | ICD-10-CM | POA: Diagnosis not present

## 2021-11-07 DIAGNOSIS — M1611 Unilateral primary osteoarthritis, right hip: Secondary | ICD-10-CM | POA: Diagnosis not present

## 2021-11-07 DIAGNOSIS — F322 Major depressive disorder, single episode, severe without psychotic features: Secondary | ICD-10-CM | POA: Diagnosis not present

## 2021-11-07 DIAGNOSIS — I10 Essential (primary) hypertension: Secondary | ICD-10-CM | POA: Diagnosis not present

## 2021-11-07 DIAGNOSIS — K219 Gastro-esophageal reflux disease without esophagitis: Secondary | ICD-10-CM | POA: Diagnosis not present

## 2021-12-02 DIAGNOSIS — Z1211 Encounter for screening for malignant neoplasm of colon: Secondary | ICD-10-CM | POA: Diagnosis not present

## 2021-12-07 DIAGNOSIS — K219 Gastro-esophageal reflux disease without esophagitis: Secondary | ICD-10-CM | POA: Diagnosis not present

## 2021-12-07 DIAGNOSIS — M1611 Unilateral primary osteoarthritis, right hip: Secondary | ICD-10-CM | POA: Diagnosis not present

## 2021-12-07 DIAGNOSIS — E1159 Type 2 diabetes mellitus with other circulatory complications: Secondary | ICD-10-CM | POA: Diagnosis not present

## 2021-12-07 DIAGNOSIS — I10 Essential (primary) hypertension: Secondary | ICD-10-CM | POA: Diagnosis not present

## 2021-12-07 DIAGNOSIS — F322 Major depressive disorder, single episode, severe without psychotic features: Secondary | ICD-10-CM | POA: Diagnosis not present

## 2021-12-24 DIAGNOSIS — H5713 Ocular pain, bilateral: Secondary | ICD-10-CM | POA: Diagnosis not present

## 2021-12-24 DIAGNOSIS — R6889 Other general symptoms and signs: Secondary | ICD-10-CM | POA: Diagnosis not present

## 2022-01-29 DIAGNOSIS — E1159 Type 2 diabetes mellitus with other circulatory complications: Secondary | ICD-10-CM | POA: Diagnosis not present

## 2022-01-29 DIAGNOSIS — I10 Essential (primary) hypertension: Secondary | ICD-10-CM | POA: Diagnosis not present

## 2022-01-29 DIAGNOSIS — K219 Gastro-esophageal reflux disease without esophagitis: Secondary | ICD-10-CM | POA: Diagnosis not present

## 2022-01-29 DIAGNOSIS — F322 Major depressive disorder, single episode, severe without psychotic features: Secondary | ICD-10-CM | POA: Diagnosis not present

## 2022-03-05 DIAGNOSIS — H43811 Vitreous degeneration, right eye: Secondary | ICD-10-CM | POA: Diagnosis not present

## 2022-03-05 DIAGNOSIS — E119 Type 2 diabetes mellitus without complications: Secondary | ICD-10-CM | POA: Diagnosis not present

## 2022-03-05 DIAGNOSIS — Z961 Presence of intraocular lens: Secondary | ICD-10-CM | POA: Diagnosis not present

## 2022-03-05 DIAGNOSIS — D3132 Benign neoplasm of left choroid: Secondary | ICD-10-CM | POA: Diagnosis not present

## 2022-03-23 DIAGNOSIS — I10 Essential (primary) hypertension: Secondary | ICD-10-CM | POA: Diagnosis not present

## 2022-03-23 DIAGNOSIS — E1159 Type 2 diabetes mellitus with other circulatory complications: Secondary | ICD-10-CM | POA: Diagnosis not present

## 2022-03-23 DIAGNOSIS — M1611 Unilateral primary osteoarthritis, right hip: Secondary | ICD-10-CM | POA: Diagnosis not present

## 2022-03-23 DIAGNOSIS — R350 Frequency of micturition: Secondary | ICD-10-CM | POA: Diagnosis not present

## 2022-03-23 DIAGNOSIS — M546 Pain in thoracic spine: Secondary | ICD-10-CM | POA: Diagnosis not present

## 2022-04-01 DIAGNOSIS — M545 Low back pain, unspecified: Secondary | ICD-10-CM | POA: Diagnosis not present

## 2022-05-06 DIAGNOSIS — M545 Low back pain, unspecified: Secondary | ICD-10-CM | POA: Diagnosis not present

## 2022-05-06 DIAGNOSIS — E8881 Metabolic syndrome: Secondary | ICD-10-CM | POA: Diagnosis not present

## 2022-05-06 DIAGNOSIS — E118 Type 2 diabetes mellitus with unspecified complications: Secondary | ICD-10-CM | POA: Diagnosis not present

## 2022-05-06 DIAGNOSIS — D519 Vitamin B12 deficiency anemia, unspecified: Secondary | ICD-10-CM | POA: Diagnosis not present

## 2022-05-06 DIAGNOSIS — K760 Fatty (change of) liver, not elsewhere classified: Secondary | ICD-10-CM | POA: Diagnosis not present

## 2022-05-06 DIAGNOSIS — K219 Gastro-esophageal reflux disease without esophagitis: Secondary | ICD-10-CM | POA: Diagnosis not present

## 2022-05-06 DIAGNOSIS — R0602 Shortness of breath: Secondary | ICD-10-CM | POA: Diagnosis not present

## 2022-05-06 DIAGNOSIS — R5383 Other fatigue: Secondary | ICD-10-CM | POA: Diagnosis not present

## 2022-05-06 DIAGNOSIS — E559 Vitamin D deficiency, unspecified: Secondary | ICD-10-CM | POA: Diagnosis not present

## 2022-05-06 DIAGNOSIS — I10 Essential (primary) hypertension: Secondary | ICD-10-CM | POA: Diagnosis not present

## 2022-05-06 DIAGNOSIS — F322 Major depressive disorder, single episode, severe without psychotic features: Secondary | ICD-10-CM | POA: Diagnosis not present

## 2022-05-20 DIAGNOSIS — D519 Vitamin B12 deficiency anemia, unspecified: Secondary | ICD-10-CM | POA: Diagnosis not present

## 2022-05-20 DIAGNOSIS — K219 Gastro-esophageal reflux disease without esophagitis: Secondary | ICD-10-CM | POA: Diagnosis not present

## 2022-05-20 DIAGNOSIS — E559 Vitamin D deficiency, unspecified: Secondary | ICD-10-CM | POA: Diagnosis not present

## 2022-05-20 DIAGNOSIS — E1159 Type 2 diabetes mellitus with other circulatory complications: Secondary | ICD-10-CM | POA: Diagnosis not present

## 2022-05-20 DIAGNOSIS — K760 Fatty (change of) liver, not elsewhere classified: Secondary | ICD-10-CM | POA: Diagnosis not present

## 2022-05-20 DIAGNOSIS — M545 Low back pain, unspecified: Secondary | ICD-10-CM | POA: Diagnosis not present

## 2022-05-20 DIAGNOSIS — E8881 Metabolic syndrome: Secondary | ICD-10-CM | POA: Diagnosis not present

## 2022-05-20 DIAGNOSIS — F3289 Other specified depressive episodes: Secondary | ICD-10-CM | POA: Diagnosis not present

## 2022-05-20 DIAGNOSIS — E118 Type 2 diabetes mellitus with unspecified complications: Secondary | ICD-10-CM | POA: Diagnosis not present

## 2022-05-20 DIAGNOSIS — F322 Major depressive disorder, single episode, severe without psychotic features: Secondary | ICD-10-CM | POA: Diagnosis not present

## 2022-05-20 DIAGNOSIS — I10 Essential (primary) hypertension: Secondary | ICD-10-CM | POA: Diagnosis not present

## 2022-05-25 DIAGNOSIS — R300 Dysuria: Secondary | ICD-10-CM | POA: Diagnosis not present

## 2022-05-25 DIAGNOSIS — N762 Acute vulvitis: Secondary | ICD-10-CM | POA: Diagnosis not present

## 2022-05-27 DIAGNOSIS — M5441 Lumbago with sciatica, right side: Secondary | ICD-10-CM | POA: Diagnosis not present

## 2022-05-27 DIAGNOSIS — E6609 Other obesity due to excess calories: Secondary | ICD-10-CM | POA: Diagnosis not present

## 2022-05-27 DIAGNOSIS — E089 Diabetes mellitus due to underlying condition without complications: Secondary | ICD-10-CM | POA: Diagnosis not present

## 2022-05-29 DIAGNOSIS — M5441 Lumbago with sciatica, right side: Secondary | ICD-10-CM | POA: Diagnosis not present

## 2022-05-29 DIAGNOSIS — E6609 Other obesity due to excess calories: Secondary | ICD-10-CM | POA: Diagnosis not present

## 2022-05-29 DIAGNOSIS — E089 Diabetes mellitus due to underlying condition without complications: Secondary | ICD-10-CM | POA: Diagnosis not present

## 2022-06-02 DIAGNOSIS — E089 Diabetes mellitus due to underlying condition without complications: Secondary | ICD-10-CM | POA: Diagnosis not present

## 2022-06-02 DIAGNOSIS — E6609 Other obesity due to excess calories: Secondary | ICD-10-CM | POA: Diagnosis not present

## 2022-06-02 DIAGNOSIS — M5441 Lumbago with sciatica, right side: Secondary | ICD-10-CM | POA: Diagnosis not present

## 2022-06-03 DIAGNOSIS — E8881 Metabolic syndrome: Secondary | ICD-10-CM | POA: Diagnosis not present

## 2022-06-03 DIAGNOSIS — M545 Low back pain, unspecified: Secondary | ICD-10-CM | POA: Diagnosis not present

## 2022-06-03 DIAGNOSIS — K219 Gastro-esophageal reflux disease without esophagitis: Secondary | ICD-10-CM | POA: Diagnosis not present

## 2022-06-03 DIAGNOSIS — D519 Vitamin B12 deficiency anemia, unspecified: Secondary | ICD-10-CM | POA: Diagnosis not present

## 2022-06-03 DIAGNOSIS — F3289 Other specified depressive episodes: Secondary | ICD-10-CM | POA: Diagnosis not present

## 2022-06-03 DIAGNOSIS — K760 Fatty (change of) liver, not elsewhere classified: Secondary | ICD-10-CM | POA: Diagnosis not present

## 2022-06-03 DIAGNOSIS — E559 Vitamin D deficiency, unspecified: Secondary | ICD-10-CM | POA: Diagnosis not present

## 2022-06-03 DIAGNOSIS — E118 Type 2 diabetes mellitus with unspecified complications: Secondary | ICD-10-CM | POA: Diagnosis not present

## 2022-06-03 DIAGNOSIS — I10 Essential (primary) hypertension: Secondary | ICD-10-CM | POA: Diagnosis not present

## 2022-06-09 DIAGNOSIS — E089 Diabetes mellitus due to underlying condition without complications: Secondary | ICD-10-CM | POA: Diagnosis not present

## 2022-06-09 DIAGNOSIS — E6609 Other obesity due to excess calories: Secondary | ICD-10-CM | POA: Diagnosis not present

## 2022-06-09 DIAGNOSIS — M5441 Lumbago with sciatica, right side: Secondary | ICD-10-CM | POA: Diagnosis not present

## 2022-06-11 DIAGNOSIS — M5441 Lumbago with sciatica, right side: Secondary | ICD-10-CM | POA: Diagnosis not present

## 2022-06-11 DIAGNOSIS — E089 Diabetes mellitus due to underlying condition without complications: Secondary | ICD-10-CM | POA: Diagnosis not present

## 2022-06-11 DIAGNOSIS — E6609 Other obesity due to excess calories: Secondary | ICD-10-CM | POA: Diagnosis not present

## 2022-06-15 DIAGNOSIS — R300 Dysuria: Secondary | ICD-10-CM | POA: Diagnosis not present

## 2022-06-15 DIAGNOSIS — N762 Acute vulvitis: Secondary | ICD-10-CM | POA: Diagnosis not present

## 2022-06-17 DIAGNOSIS — D519 Vitamin B12 deficiency anemia, unspecified: Secondary | ICD-10-CM | POA: Diagnosis not present

## 2022-06-17 DIAGNOSIS — E118 Type 2 diabetes mellitus with unspecified complications: Secondary | ICD-10-CM | POA: Diagnosis not present

## 2022-06-17 DIAGNOSIS — R399 Unspecified symptoms and signs involving the genitourinary system: Secondary | ICD-10-CM | POA: Diagnosis not present

## 2022-06-17 DIAGNOSIS — E559 Vitamin D deficiency, unspecified: Secondary | ICD-10-CM | POA: Diagnosis not present

## 2022-06-17 DIAGNOSIS — K760 Fatty (change of) liver, not elsewhere classified: Secondary | ICD-10-CM | POA: Diagnosis not present

## 2022-06-17 DIAGNOSIS — F3289 Other specified depressive episodes: Secondary | ICD-10-CM | POA: Diagnosis not present

## 2022-06-17 DIAGNOSIS — I10 Essential (primary) hypertension: Secondary | ICD-10-CM | POA: Diagnosis not present

## 2022-06-17 DIAGNOSIS — M545 Low back pain, unspecified: Secondary | ICD-10-CM | POA: Diagnosis not present

## 2022-06-17 DIAGNOSIS — E8881 Metabolic syndrome: Secondary | ICD-10-CM | POA: Diagnosis not present

## 2022-06-17 DIAGNOSIS — K219 Gastro-esophageal reflux disease without esophagitis: Secondary | ICD-10-CM | POA: Diagnosis not present

## 2022-06-17 DIAGNOSIS — M1990 Unspecified osteoarthritis, unspecified site: Secondary | ICD-10-CM | POA: Diagnosis not present

## 2022-07-15 DIAGNOSIS — R399 Unspecified symptoms and signs involving the genitourinary system: Secondary | ICD-10-CM | POA: Diagnosis not present

## 2022-07-15 DIAGNOSIS — F3289 Other specified depressive episodes: Secondary | ICD-10-CM | POA: Diagnosis not present

## 2022-07-15 DIAGNOSIS — E118 Type 2 diabetes mellitus with unspecified complications: Secondary | ICD-10-CM | POA: Diagnosis not present

## 2022-07-15 DIAGNOSIS — D519 Vitamin B12 deficiency anemia, unspecified: Secondary | ICD-10-CM | POA: Diagnosis not present

## 2022-07-15 DIAGNOSIS — E559 Vitamin D deficiency, unspecified: Secondary | ICD-10-CM | POA: Diagnosis not present

## 2022-07-15 DIAGNOSIS — I10 Essential (primary) hypertension: Secondary | ICD-10-CM | POA: Diagnosis not present

## 2022-07-15 DIAGNOSIS — E8881 Metabolic syndrome: Secondary | ICD-10-CM | POA: Diagnosis not present

## 2022-07-15 DIAGNOSIS — M1990 Unspecified osteoarthritis, unspecified site: Secondary | ICD-10-CM | POA: Diagnosis not present

## 2022-07-15 DIAGNOSIS — K760 Fatty (change of) liver, not elsewhere classified: Secondary | ICD-10-CM | POA: Diagnosis not present

## 2022-07-15 DIAGNOSIS — K219 Gastro-esophageal reflux disease without esophagitis: Secondary | ICD-10-CM | POA: Diagnosis not present

## 2022-07-29 ENCOUNTER — Other Ambulatory Visit (HOSPITAL_COMMUNITY): Payer: Self-pay | Admitting: Gastroenterology

## 2022-07-29 ENCOUNTER — Other Ambulatory Visit: Payer: Self-pay | Admitting: Gastroenterology

## 2022-07-29 DIAGNOSIS — R112 Nausea with vomiting, unspecified: Secondary | ICD-10-CM | POA: Diagnosis not present

## 2022-07-29 DIAGNOSIS — L570 Actinic keratosis: Secondary | ICD-10-CM | POA: Diagnosis not present

## 2022-07-29 DIAGNOSIS — R140 Abdominal distension (gaseous): Secondary | ICD-10-CM | POA: Diagnosis not present

## 2022-07-29 DIAGNOSIS — L821 Other seborrheic keratosis: Secondary | ICD-10-CM | POA: Diagnosis not present

## 2022-07-29 DIAGNOSIS — L720 Epidermal cyst: Secondary | ICD-10-CM | POA: Diagnosis not present

## 2022-07-29 DIAGNOSIS — L820 Inflamed seborrheic keratosis: Secondary | ICD-10-CM | POA: Diagnosis not present

## 2022-07-29 DIAGNOSIS — L209 Atopic dermatitis, unspecified: Secondary | ICD-10-CM | POA: Diagnosis not present

## 2022-08-07 ENCOUNTER — Ambulatory Visit (HOSPITAL_COMMUNITY)
Admission: RE | Admit: 2022-08-07 | Discharge: 2022-08-07 | Disposition: A | Discharge status: Home / Self Care | Payer: PPO | Source: Ambulatory Visit | Attending: Gastroenterology | Admitting: Gastroenterology

## 2022-08-07 DIAGNOSIS — I10 Essential (primary) hypertension: Secondary | ICD-10-CM | POA: Diagnosis not present

## 2022-08-07 DIAGNOSIS — R112 Nausea with vomiting, unspecified: Secondary | ICD-10-CM | POA: Diagnosis not present

## 2022-08-07 DIAGNOSIS — F322 Major depressive disorder, single episode, severe without psychotic features: Secondary | ICD-10-CM | POA: Diagnosis not present

## 2022-08-07 DIAGNOSIS — E1159 Type 2 diabetes mellitus with other circulatory complications: Secondary | ICD-10-CM | POA: Diagnosis not present

## 2022-08-07 DIAGNOSIS — K219 Gastro-esophageal reflux disease without esophagitis: Secondary | ICD-10-CM | POA: Diagnosis not present

## 2022-08-07 MED ORDER — TECHNETIUM TC 99M SULFUR COLLOID
2.2000 | Freq: Once | INTRAVENOUS | Status: AC | PRN
  Administered 2022-08-07: 2.2 via ORAL

## 2022-08-13 DIAGNOSIS — M25562 Pain in left knee: Secondary | ICD-10-CM | POA: Diagnosis not present

## 2022-08-13 DIAGNOSIS — I10 Essential (primary) hypertension: Secondary | ICD-10-CM | POA: Diagnosis not present

## 2022-08-13 DIAGNOSIS — Z6841 Body Mass Index (BMI) 40.0 and over, adult: Secondary | ICD-10-CM | POA: Diagnosis not present

## 2022-08-13 DIAGNOSIS — Z72820 Sleep deprivation: Secondary | ICD-10-CM | POA: Diagnosis not present

## 2022-08-13 DIAGNOSIS — K3184 Gastroparesis: Secondary | ICD-10-CM | POA: Diagnosis not present

## 2022-08-13 DIAGNOSIS — E65 Localized adiposity: Secondary | ICD-10-CM | POA: Diagnosis not present

## 2022-08-13 DIAGNOSIS — E1159 Type 2 diabetes mellitus with other circulatory complications: Secondary | ICD-10-CM | POA: Diagnosis not present

## 2022-08-18 DIAGNOSIS — M170 Bilateral primary osteoarthritis of knee: Secondary | ICD-10-CM | POA: Diagnosis not present

## 2022-08-26 DIAGNOSIS — M1611 Unilateral primary osteoarthritis, right hip: Secondary | ICD-10-CM | POA: Diagnosis not present

## 2022-08-26 DIAGNOSIS — I10 Essential (primary) hypertension: Secondary | ICD-10-CM | POA: Diagnosis not present

## 2022-08-26 DIAGNOSIS — M1990 Unspecified osteoarthritis, unspecified site: Secondary | ICD-10-CM | POA: Diagnosis not present

## 2022-08-26 DIAGNOSIS — E1159 Type 2 diabetes mellitus with other circulatory complications: Secondary | ICD-10-CM | POA: Diagnosis not present

## 2022-08-26 DIAGNOSIS — D519 Vitamin B12 deficiency anemia, unspecified: Secondary | ICD-10-CM | POA: Diagnosis not present

## 2022-08-26 DIAGNOSIS — K219 Gastro-esophageal reflux disease without esophagitis: Secondary | ICD-10-CM | POA: Diagnosis not present

## 2022-08-26 DIAGNOSIS — F322 Major depressive disorder, single episode, severe without psychotic features: Secondary | ICD-10-CM | POA: Diagnosis not present

## 2022-08-27 DIAGNOSIS — K3184 Gastroparesis: Secondary | ICD-10-CM | POA: Diagnosis not present

## 2022-08-27 DIAGNOSIS — N3090 Cystitis, unspecified without hematuria: Secondary | ICD-10-CM | POA: Diagnosis not present

## 2022-08-27 DIAGNOSIS — M1990 Unspecified osteoarthritis, unspecified site: Secondary | ICD-10-CM | POA: Diagnosis not present

## 2022-08-27 DIAGNOSIS — M25562 Pain in left knee: Secondary | ICD-10-CM | POA: Diagnosis not present

## 2022-08-27 DIAGNOSIS — E65 Localized adiposity: Secondary | ICD-10-CM | POA: Diagnosis not present

## 2022-08-27 DIAGNOSIS — E118 Type 2 diabetes mellitus with unspecified complications: Secondary | ICD-10-CM | POA: Diagnosis not present

## 2022-08-27 DIAGNOSIS — I10 Essential (primary) hypertension: Secondary | ICD-10-CM | POA: Diagnosis not present

## 2022-09-08 DIAGNOSIS — I509 Heart failure, unspecified: Secondary | ICD-10-CM | POA: Diagnosis not present

## 2022-09-08 DIAGNOSIS — E1159 Type 2 diabetes mellitus with other circulatory complications: Secondary | ICD-10-CM | POA: Diagnosis not present

## 2022-09-10 DIAGNOSIS — N3090 Cystitis, unspecified without hematuria: Secondary | ICD-10-CM | POA: Diagnosis not present

## 2022-09-10 DIAGNOSIS — I10 Essential (primary) hypertension: Secondary | ICD-10-CM | POA: Diagnosis not present

## 2022-09-10 DIAGNOSIS — Z6841 Body Mass Index (BMI) 40.0 and over, adult: Secondary | ICD-10-CM | POA: Diagnosis not present

## 2022-09-10 DIAGNOSIS — M25562 Pain in left knee: Secondary | ICD-10-CM | POA: Diagnosis not present

## 2022-09-10 DIAGNOSIS — K3184 Gastroparesis: Secondary | ICD-10-CM | POA: Diagnosis not present

## 2022-09-10 DIAGNOSIS — E118 Type 2 diabetes mellitus with unspecified complications: Secondary | ICD-10-CM | POA: Diagnosis not present

## 2022-09-10 DIAGNOSIS — E65 Localized adiposity: Secondary | ICD-10-CM | POA: Diagnosis not present

## 2022-09-11 DIAGNOSIS — E559 Vitamin D deficiency, unspecified: Secondary | ICD-10-CM | POA: Diagnosis not present

## 2022-09-11 DIAGNOSIS — E1159 Type 2 diabetes mellitus with other circulatory complications: Secondary | ICD-10-CM | POA: Diagnosis not present

## 2022-09-11 DIAGNOSIS — E538 Deficiency of other specified B group vitamins: Secondary | ICD-10-CM | POA: Diagnosis not present

## 2022-09-11 DIAGNOSIS — E261 Secondary hyperaldosteronism: Secondary | ICD-10-CM | POA: Diagnosis not present

## 2022-09-11 DIAGNOSIS — B379 Candidiasis, unspecified: Secondary | ICD-10-CM | POA: Diagnosis not present

## 2022-09-11 DIAGNOSIS — I110 Hypertensive heart disease with heart failure: Secondary | ICD-10-CM | POA: Diagnosis not present

## 2022-09-11 DIAGNOSIS — I509 Heart failure, unspecified: Secondary | ICD-10-CM | POA: Diagnosis not present

## 2022-09-11 DIAGNOSIS — E1165 Type 2 diabetes mellitus with hyperglycemia: Secondary | ICD-10-CM | POA: Diagnosis not present

## 2022-09-11 DIAGNOSIS — E785 Hyperlipidemia, unspecified: Secondary | ICD-10-CM | POA: Diagnosis not present

## 2022-09-11 DIAGNOSIS — E1169 Type 2 diabetes mellitus with other specified complication: Secondary | ICD-10-CM | POA: Diagnosis not present

## 2022-09-11 DIAGNOSIS — Z6841 Body Mass Index (BMI) 40.0 and over, adult: Secondary | ICD-10-CM | POA: Diagnosis not present

## 2022-09-15 DIAGNOSIS — Z01419 Encounter for gynecological examination (general) (routine) without abnormal findings: Secondary | ICD-10-CM | POA: Diagnosis not present

## 2022-09-15 DIAGNOSIS — Z6841 Body Mass Index (BMI) 40.0 and over, adult: Secondary | ICD-10-CM | POA: Diagnosis not present

## 2022-09-21 ENCOUNTER — Ambulatory Visit: Payer: PPO | Attending: Internal Medicine | Admitting: Internal Medicine

## 2022-09-21 ENCOUNTER — Encounter: Payer: Self-pay | Admitting: Internal Medicine

## 2022-09-21 VITALS — BP 138/64 | HR 84 | Ht 62.0 in | Wt 267.0 lb

## 2022-09-21 DIAGNOSIS — R072 Precordial pain: Secondary | ICD-10-CM

## 2022-09-21 MED ORDER — METOPROLOL TARTRATE 50 MG PO TABS: 50.0000 mg | ORAL_TABLET | Freq: Once | ORAL | 0 refills | Status: DC

## 2022-09-21 MED ORDER — APIXABAN 5 MG PO TABS: 5.0000 mg | ORAL_TABLET | Freq: Two times a day (BID) | ORAL | 5 refills | Status: DC

## 2022-09-21 NOTE — Progress Notes (Signed)
Cardiology Office Note:    Date:  09/21/2022   ID:  Heidi Byrd, DOB 1945-11-27, MRN [id number]  PCP:  Heidi Melter, MD   [place] Providers Cardiologist:  None     Referring MD: Heidi Melter, MD   No chief complaint on file. Establish care  History of Present Illness:    Heidi Byrd is a 76 y.o. female with a hx of HTN, OSA, obesity BMI 48, referral to establish care  Comes from [place]. She was seen [facility]; ST depression with exercise treadmill. Did not get LHC. This was 05/10/1997. Echo showed normal LV/RV function, no significant valve dx.  ECG - atrial flutter 05/06/2022, not on [facility]  She can get a pain in the center chest wile watching TV. Not associated with activity. She notes palpitations  No hospitalizations for diuresis. Had some feet swelling. Was on Januvia at the time. This was stopped and she notes improvement. Recommended to stick to strict Na diet.   Past Medical History:  Diagnosis Date   Anxiety    Cholelithiasis with chronic cholecystitis 07/04/2018   Cough    due to nausea vomiting no fever had cough since sunday 06-19-18   Depression    Diabetes mellitus without complication ([facility])    type 2    Dry eyes    Dry mouth    Essential tremor 04/15/2016   Fatty liver    GERD (gastroesophageal reflux disease)    Headache    hx of migraines prior to menopause, tension headaches now   Hemorrhoids    Hyperglycemia    Hypertension    Migraine headache    Obesity    Precordial pain 09/14/2013   Prediabetes    Right medial knee pain 06/14/2014   Seasonal allergic reaction    Sleep apnea    no cpap used   Vitamin D deficiency     Past Surgical History:  Procedure Laterality Date   BIOPSY  09/24/2020   Procedure: BIOPSY;  Surgeon: Ronald Lobo, MD;  Location: [facility];  Service: Endoscopy;;   CARPAL TUNNEL RELEASE Bilateral 1990's   childbirth  1973, 1974   hemorrahe after one childbirth, no blood given    CHOLECYSTECTOMY N/A 07/04/2018   Procedure: LAPAROSCOPIC CHOLECYSTECTOMY WITH INTRAOPERATIVE CHOLANGIOGRAM;  Surgeon: Excell Seltzer, MD;  Location: [facility];  Service: General;  Laterality: N/A;   colonscopy  8 yrs ago   with benign polyps   ESOPHAGOGASTRODUODENOSCOPY (EGD) WITH PROPOFOL N/A 09/24/2020   Procedure: ESOPHAGOGASTRODUODENOSCOPY (EGD) WITH PROPOFOL;  Surgeon: Ronald Lobo, MD;  Location: [facility];  Service: Endoscopy;  Laterality: N/A;   EYE SURGERY Bilateral 2016   ioc lens implant for catracts , both  eyes laser surgery for film removal   INCONTINENCE SURGERY  1997   bladder sling  and prlapse correction   KNEE SURGERY Right 2016   later and medial muscle repair    TONSILLECTOMY AND ADENOIDECTOMY  age 50   [place]  2008   x 4 fingers both thums, right pointer and middle finger   TUBAL LIGATION  1980's    Current Medications: Current Meds  Medication Sig   cetirizine (ZYRTEC) 10 MG tablet Take 10 mg by mouth daily as needed for allergies.   Cholecalciferol (VITAMIN D-3) 5000 units TABS Take 5,000 Units by mouth daily.   losartan (COZAAR) 50 MG tablet Take 50 mg by mouth daily.   lovastatin (MEVACOR) 10 MG tablet Take 10 mg by mouth every  evening.   metoprolol succinate (TOPROL-XL) 50 MG 24 hr tablet Take 75 mg by mouth daily.    nystatin powder Nystatin   oxymetazoline (AFRIN NASAL SPRAY) 0.05 % nasal spray Place 1 spray into both nostrils 2 (two) times daily.   pantoprazole (PROTONIX) 40 MG tablet Take 40 mg by mouth 2 (two) times daily before a meal.      Allergies:   Tetracyclines & related, Augmentin [amoxicillin-pot clavulanate], Calcium channel blockers, Codeine, Hydrocodone-acetaminophen, Other, Primidone, and Wellbutrin [bupropion]   Social History   Socioeconomic History   Marital status: Divorced    Spouse name: Not on file   Number of children: 2   Years of education: 14   Highest education level: Not on file  Occupational  History   Occupation: Retired  Tobacco Use   Smoking status: Never   Smokeless tobacco: Never  Vaping Use   Vaping Use: Never used  Substance and Sexual Activity   Alcohol use: No   Drug use: No   Sexual activity: Not on file  Other Topics Concern   Not on file  Social History Narrative   Mom lives with her and she is the caregiver.    Caffeine use: Rare-soda   Rare-tea   Social Determinants of Health   Financial Resource Strain: Not on file  Food Insecurity: Not on file  Transportation Needs: Not on file  Physical Activity: Not on file  Stress: Not on file  Social Connections: Not on file     Family History: The patient's family history includes Bone cancer in her maternal grandfather; Diabetes in her mother; Hypertension in her mother; Osteoporosis in her mother.  ROS:   Please see the history of present illness.     All other systems reviewed and are negative.  EKGs/Labs/Other Studies Reviewed:    The following studies were reviewed today:   EKG:  EKG is  ordered today.  The ekg ordered today demonstrates   09/21/2022- NSR  Recent Labs: No results found for requested labs within last 365 days.  Recent Lipid Panel No results found for: "CHOL", "TRIG", "HDL", "CHOLHDL", "VLDL", "LDLCALC", "LDLDIRECT"   Risk Assessment/Calculations:    CHA2DS2-VASc Score = 4   This indicates a 4.8% annual risk of stroke. The patient's score is based upon: CHF History: 0 HTN History: 1 Diabetes History: 1 Stroke History: 0 Vascular Disease History: 0 Age Score: 1 Gender Score: 1               Physical Exam:    VS:   Vitals:   09/21/22 1323  BP: 138/64  Pulse: 84  SpO2: 97%     BP 138/64   Pulse 84   Ht 5' 2"  (1.575 m)   Wt 267 lb (121.1 kg)   SpO2 97%   BMI 48.83 kg/m     Wt Readings from Last 3 Encounters:  09/21/22 267 lb (121.1 kg)  09/24/20 233 lb (105.7 kg)  06/21/20 249 lb (112.9 kg)     GEN:  Well nourished, well developed in no acute  distress HEENT: Normal NECK: No JVD; No carotid bruits LYMPHATICS: No lymphadenopathy CARDIAC: RRR, no murmurs, rubs, gallops RESPIRATORY:  Clear to auscultation without rales, wheezing or rhonchi  ABDOMEN: Soft, non-tender, non-distended MUSCULOSKELETAL:  No edema; No deformity  SKIN: Warm and dry NEUROLOGIC:  Alert and oriented x 3 PSYCHIATRIC:  Normal affect   ASSESSMENT:   CP: with signifcant risk factors; may have atypical symptoms for angina. Will get CTA  Atrial Flutter: 05/06/2022. Continue metop XL 75 mg daily. Notes some palpitations. Sinus rhythm today. With CHADS2VASC=4 will start eliquis.   DM2: ozempic gave gastroparesis. Off jardiance with yeast/bladder infections. A1c 6.3% 09/08/2022; A1c goal <7; managed by PCP  HLD: lovastatin 10 mg daily. LDL 71 mg/dL  HTN: continue losartan  50 mg daily  PLAN:    In order of problems listed above:  TTE Start eliquis 5 mg BID Coronary CTA with morphology 50 mg metop tartrate x1 Follow up 6 months           Medication Adjustments/Labs and Tests Ordered: Current medicines are reviewed at length with the patient today.  Concerns regarding medicines are outlined above.  No orders of the defined types were placed in this encounter.  No orders of the defined types were placed in this encounter.   There are no Patient Instructions on file for this visit.   Signed, Janina Mayo, MD  09/21/2022 1:54 PM    [place]

## 2022-09-21 NOTE — Patient Instructions (Signed)
Medication Instructions:   START: ELIQUIS 94m TWICE DAILY   PLEASE TAKE METOPROLOL TARTRATE 568mTWO HOURS PRIOR TO CCTA SCAN   *If you need a refill on your cardiac medications before your next appointment, please call your pharmacy*  Lab Work: BLOOD WORK TODAY  If you have labs (blood work) drawn today and your tests are completely normal, you will receive your results only by: MyMonte[place]umaif you have MyChart) OR A paper copy in the mail If you have any lab test that is abnormal or we need to change your treatment, we will call you to review the results.  Testing/Procedures: Your physician has requested that you have an echocardiogram. Echocardiography is a painless test that uses sound waves to create images of your heart. It provides your doctor with information about the size and shape of your heart and how well your heart's chambers and valves are working. You may receive an ultrasound enhancing agent through an IV if needed to better visualize your heart during the echo.This procedure takes approximately one hour. There are no restrictions for this procedure. [address]   Your physician has requested that you have cardiac CT. Cardiac computed tomography (CT) is a painless test that uses an x-ray machine to take clear, detailed pictures of your heart. For further information please visit [email or web address] follow instruction sheet as given.   Follow-Up: [facility] your [facility] are our priority.  As part of our continuing mission to provide you with exceptional heart care, we have created designated Provider Care Teams.  These Care Teams include your primary Cardiologist (physician) and Advanced Practice Providers (APPs -  Physician Assistants and Nurse Practitioners) who all work together to provide you with the care you need, when you need it.  Your next appointment:   6 month(s)  The format for  your next appointment:   In Person  Provider:   BrJanina MayoMD     Other Instructions   Your cardiac CT will be scheduled at one of the below locations:   [facility] [phone] scheduled at [facility] minutes prior to test start time. You can use the FREE valet parking offered at entrance C (encouraged to control the heart rate for the test)  [facility] (first floor) to check-in and test prep.  All radiology patients and guests should use entrance C2 at [facility] though the hospital's physical address listed is [address]    Please follow these instructions carefully (unless otherwise directed):  Hold all erectile dysfunction medications at least 3 days (72 hrs) prior to test. (Ie viagra, cialis, sildenafil, tadalafil, etc) We will administer nitroglycerin during this exam.   On the Night Before the Test: Be sure to Drink plenty of water. Do not consume any caffeinated/decaffeinated beverages or chocolate 12 hours prior to your test. Do not take any antihistamines 12 hours prior to your test.  On the Day of the Test: Drink plenty of water until 1 hour prior to the test. Do not eat any food 1 hour prior to test. You may take your regular medications prior to the test.  Take metoprolol (Lopressor) two hours prior to test.  HOLD Furosemide/Hydrochlorothiazide morning of the test. FEMALES- please wear underwire-free bra if available, avoid dresses & tight clothing  After the Test: Drink plenty of water. After receiving IV contrast, you may experience a mild flushed feeling. This is normal. On occasion, you may experience a mild rash up to 24 hours after the test. This is not dangerous. If this occurs, you  can take Benadryl 25 mg and increase your fluid intake. If you experience trouble breathing, this can be serious. If it is severe call 911 IMMEDIATELY. If it is mild, please call our office. If you take any of these medications: Glipizide/Metformin, Avandament, Glucavance, please do not take 48 hours after completing test unless otherwise instructed.  We will call to schedule your test 2-4 weeks out understanding that some insurance companies will need an authorization prior to the service being performed.   For non-scheduling related questions, please contact the cardiac imaging nurse navigator should you have any questions/concerns: Marchia Bond, Cardiac Imaging Nurse Navigator Gordy Clement, Cardiac Imaging Nurse Navigator [facility] Direct Office Dial: [phone]   For scheduling needs, including cancellations and rescheduling, please call [place], [phone].

## 2022-09-22 LAB — BASIC METABOLIC PANEL
BUN/Creatinine Ratio: 20 (ref 12–28)
BUN: 18 mg/dL (ref 8–27)
CO2: 25 mmol/L (ref 20–29)
Calcium: 9.8 mg/dL (ref 8.7–10.3)
Chloride: 98 mmol/L (ref 96–106)
Creatinine, Ser: 0.89 mg/dL (ref 0.57–1.00)
Glucose: 95 mg/dL (ref 70–99)
Potassium: 4.8 mmol/L (ref 3.5–5.2)
Sodium: 139 mmol/L (ref 134–144)
eGFR: 68 mL/min/{1.73_m2} (ref >59)

## 2022-09-23 ENCOUNTER — Other Ambulatory Visit (HOSPITAL_BASED_OUTPATIENT_CLINIC_OR_DEPARTMENT_OTHER): Payer: Self-pay | Admitting: Family Medicine

## 2022-09-23 DIAGNOSIS — I10 Essential (primary) hypertension: Secondary | ICD-10-CM | POA: Diagnosis not present

## 2022-09-23 DIAGNOSIS — I4892 Unspecified atrial flutter: Secondary | ICD-10-CM | POA: Diagnosis not present

## 2022-09-23 DIAGNOSIS — K219 Gastro-esophageal reflux disease without esophagitis: Secondary | ICD-10-CM | POA: Diagnosis not present

## 2022-09-23 DIAGNOSIS — M8580 Other specified disorders of bone density and structure, unspecified site: Secondary | ICD-10-CM | POA: Diagnosis not present

## 2022-09-23 DIAGNOSIS — E785 Hyperlipidemia, unspecified: Secondary | ICD-10-CM | POA: Diagnosis not present

## 2022-09-23 DIAGNOSIS — Z0000 Encounter for general adult medical examination without abnormal findings: Secondary | ICD-10-CM | POA: Diagnosis not present

## 2022-09-23 DIAGNOSIS — E1159 Type 2 diabetes mellitus with other circulatory complications: Secondary | ICD-10-CM | POA: Diagnosis not present

## 2022-09-23 DIAGNOSIS — D6869 Other thrombophilia: Secondary | ICD-10-CM | POA: Diagnosis not present

## 2022-09-23 DIAGNOSIS — M810 Age-related osteoporosis without current pathological fracture: Secondary | ICD-10-CM

## 2022-09-30 DIAGNOSIS — Z1231 Encounter for screening mammogram for malignant neoplasm of breast: Secondary | ICD-10-CM | POA: Diagnosis not present

## 2022-10-02 ENCOUNTER — Telehealth (HOSPITAL_COMMUNITY): Payer: Self-pay | Admitting: Emergency Medicine

## 2022-10-02 NOTE — Telephone Encounter (Signed)
Reaching out to patient to offer assistance regarding upcoming cardiac imaging study; pt verbalizes understanding of appt date/time, parking situation and where to check in, pre-test NPO status and medications ordered, and verified current allergies; name and call back number provided for further questions should they arise Tuff Clabo RN Navigator Cardiac Imaging [facility] [phone] office [phone] cell 

## 2022-10-05 ENCOUNTER — Ambulatory Visit (HOSPITAL_COMMUNITY)
Admission: RE | Admit: 2022-10-05 | Discharge: 2022-10-05 | Disposition: A | Discharge status: Home / Self Care | Payer: PPO | Source: Ambulatory Visit | Attending: Internal Medicine | Admitting: Internal Medicine

## 2022-10-05 ENCOUNTER — Encounter (HOSPITAL_COMMUNITY): Payer: Self-pay

## 2022-10-05 DIAGNOSIS — R072 Precordial pain: Secondary | ICD-10-CM | POA: Diagnosis not present

## 2022-10-05 MED ORDER — NITROGLYCERIN 0.4 MG SL SUBL
SUBLINGUAL_TABLET | SUBLINGUAL | Status: AC
  Filled 2022-10-05: qty 2

## 2022-10-05 MED ORDER — NITROGLYCERIN 0.4 MG SL SUBL
0.8000 mg | SUBLINGUAL_TABLET | Freq: Once | SUBLINGUAL | Status: AC
  Administered 2022-10-05: 0.8 mg via SUBLINGUAL

## 2022-10-05 MED ORDER — METOPROLOL TARTRATE 5 MG/5ML IV SOLN
INTRAVENOUS | Status: AC
  Filled 2022-10-05: qty 10

## 2022-10-05 MED ORDER — IOHEXOL 350 MG/ML SOLN
110.0000 mL | Freq: Once | INTRAVENOUS | Status: AC | PRN
  Administered 2022-10-05: 110 mL via INTRAVENOUS

## 2022-10-07 ENCOUNTER — Ambulatory Visit (HOSPITAL_BASED_OUTPATIENT_CLINIC_OR_DEPARTMENT_OTHER)
Admission: RE | Admit: 2022-10-07 | Discharge: 2022-10-07 | Disposition: A | Discharge status: Home / Self Care | Payer: PPO | Source: Ambulatory Visit | Attending: Family Medicine | Admitting: Family Medicine

## 2022-10-07 DIAGNOSIS — M810 Age-related osteoporosis without current pathological fracture: Secondary | ICD-10-CM

## 2022-10-16 ENCOUNTER — Ambulatory Visit (HOSPITAL_COMMUNITY): Payer: PPO | Attending: Internal Medicine

## 2022-10-16 DIAGNOSIS — R072 Precordial pain: Secondary | ICD-10-CM

## 2022-10-16 LAB — ECHOCARDIOGRAM COMPLETE
Area-P 1/2: 4.21 cm2
S' Lateral: 2.6 cm

## 2022-10-16 MED ORDER — PERFLUTREN LIPID MICROSPHERE
1.0000 mL | INTRAVENOUS | Status: AC | PRN
  Administered 2022-10-16: 2 mL via INTRAVENOUS

## 2022-10-19 ENCOUNTER — Ambulatory Visit (HOSPITAL_BASED_OUTPATIENT_CLINIC_OR_DEPARTMENT_OTHER)
Admission: RE | Admit: 2022-10-19 | Discharge: 2022-10-19 | Disposition: A | Discharge status: Home / Self Care | Payer: PPO | Source: Ambulatory Visit | Attending: Family Medicine | Admitting: Family Medicine

## 2022-10-19 DIAGNOSIS — M25562 Pain in left knee: Secondary | ICD-10-CM | POA: Diagnosis not present

## 2022-10-19 DIAGNOSIS — M810 Age-related osteoporosis without current pathological fracture: Secondary | ICD-10-CM | POA: Insufficient documentation

## 2022-10-19 DIAGNOSIS — E118 Type 2 diabetes mellitus with unspecified complications: Secondary | ICD-10-CM | POA: Diagnosis not present

## 2022-10-19 DIAGNOSIS — I10 Essential (primary) hypertension: Secondary | ICD-10-CM | POA: Diagnosis not present

## 2022-10-19 DIAGNOSIS — K3184 Gastroparesis: Secondary | ICD-10-CM | POA: Diagnosis not present

## 2022-10-19 DIAGNOSIS — E65 Localized adiposity: Secondary | ICD-10-CM | POA: Diagnosis not present

## 2022-10-19 DIAGNOSIS — Z6841 Body Mass Index (BMI) 40.0 and over, adult: Secondary | ICD-10-CM | POA: Diagnosis not present

## 2022-10-20 ENCOUNTER — Telehealth: Payer: Self-pay | Admitting: Internal Medicine

## 2022-10-20 NOTE — Telephone Encounter (Signed)
Caren Griffins with [employer] states on 12/06 they sent a patient assistance application for Eliquis to the office. She would like to confirm received and discuss any updates. She states the deadline to have the application sent in is 71/95.

## 2022-10-20 NOTE — Telephone Encounter (Signed)
Called and spoke with Caren Griffins from [employer], who states that patient assistance forms have been faxed to office for Dr. Harl Bowie to sign. Advised her that this has not been received in the office. Advised her to please refax forms and will keep an eye out for them. Caren Griffins states that these need to be done by 12/15- advised her that we will have signed once received. Caren Griffins verbalized understanding.

## 2022-10-21 NOTE — Telephone Encounter (Signed)
Spoke with Caren Griffins regarding pt assistance forms for Eliquis. Unable to locate fax. Caren Griffins will refax to [phone].

## 2022-10-21 NOTE — Telephone Encounter (Signed)
Caller stated she will be faxing forms for patient assistance with Eliquis medication.  Caller stated patient will need this completed by 12/15. Caller would like call back to follow-up on this documentation.

## 2022-10-21 NOTE — Telephone Encounter (Signed)
Tried to call back but office is closed at this time.

## 2022-10-21 NOTE — Telephone Encounter (Signed)
Spoke with Caren Griffins from [employer]. She will fax patient assistance form to [phone].

## 2022-10-21 NOTE — Telephone Encounter (Signed)
  Caren Griffins is calling to verify if pt assistance form was received. She said, Per [place] someone will be on the look out today since she is off today. She said the pt assistance is due on 10/23/22.

## 2022-10-22 NOTE — Telephone Encounter (Signed)
Patient assistance application filled out and faxed back to foundation.   Caren Griffins at [facility] made aware that application has been faxed.

## 2022-10-22 NOTE — Telephone Encounter (Signed)
Caren Griffins from [place] is f/u regarding patient assistance forms. Advised of [place]'s documentation that forms were received and put on Dr. Nelly Laurence cart to sign. Caren Griffins is requesting a callback today due to the due date being tomorrow and hoping this can be signed and faxed today. She states to request to speak with Caren Griffins at [place] when calling back due to it going to an operator that calls are directed to.

## 2022-10-23 NOTE — Telephone Encounter (Signed)
Attempted to return call to [place] at [place]. Left message stating that fax was sent over yesterday and we did receive a confirmation. Call back with any issues.

## 2022-10-23 NOTE — Telephone Encounter (Signed)
Calling back to say the foundation didn't get the fax. Asking that our office fax it over again today, today is the deadline. Fax [phone]. Please advise

## 2022-11-06 ENCOUNTER — Telehealth: Payer: Self-pay

## 2022-11-06 NOTE — Telephone Encounter (Signed)
Received fax from [facility] patient assistance stating that they are missing patients proof of income. Called and spoke with patient who states that she provided the information already. Advised patient that I will re fax this to foundation. This has been refaxed will wait to hear back. Patient has about a months worth of samples of Eliquis left- advised her to reach out if any issues or we don't hear back from assistance foundation in time. Patient aware and verbalized understanding.

## 2022-11-10 ENCOUNTER — Encounter: Payer: Self-pay | Admitting: Internal Medicine

## 2022-11-25 DIAGNOSIS — M810 Age-related osteoporosis without current pathological fracture: Secondary | ICD-10-CM | POA: Diagnosis not present

## 2022-11-25 DIAGNOSIS — R059 Cough, unspecified: Secondary | ICD-10-CM | POA: Diagnosis not present

## 2022-11-25 DIAGNOSIS — E559 Vitamin D deficiency, unspecified: Secondary | ICD-10-CM | POA: Diagnosis not present

## 2022-11-26 DIAGNOSIS — Z0000 Encounter for general adult medical examination without abnormal findings: Secondary | ICD-10-CM | POA: Diagnosis not present

## 2022-11-26 DIAGNOSIS — E65 Localized adiposity: Secondary | ICD-10-CM | POA: Diagnosis not present

## 2022-11-26 DIAGNOSIS — E118 Type 2 diabetes mellitus with unspecified complications: Secondary | ICD-10-CM | POA: Diagnosis not present

## 2022-11-26 DIAGNOSIS — Z6841 Body Mass Index (BMI) 40.0 and over, adult: Secondary | ICD-10-CM | POA: Diagnosis not present

## 2022-11-26 DIAGNOSIS — K3184 Gastroparesis: Secondary | ICD-10-CM | POA: Diagnosis not present

## 2022-12-23 DIAGNOSIS — K3184 Gastroparesis: Secondary | ICD-10-CM | POA: Diagnosis not present

## 2022-12-23 DIAGNOSIS — E118 Type 2 diabetes mellitus with unspecified complications: Secondary | ICD-10-CM | POA: Diagnosis not present

## 2022-12-23 DIAGNOSIS — Z6841 Body Mass Index (BMI) 40.0 and over, adult: Secondary | ICD-10-CM | POA: Diagnosis not present

## 2023-01-06 DIAGNOSIS — R110 Nausea: Secondary | ICD-10-CM | POA: Diagnosis not present

## 2023-01-06 DIAGNOSIS — E1159 Type 2 diabetes mellitus with other circulatory complications: Secondary | ICD-10-CM | POA: Diagnosis not present

## 2023-01-06 DIAGNOSIS — R197 Diarrhea, unspecified: Secondary | ICD-10-CM | POA: Diagnosis not present

## 2023-01-06 DIAGNOSIS — R1013 Epigastric pain: Secondary | ICD-10-CM | POA: Diagnosis not present

## 2023-01-07 DIAGNOSIS — R197 Diarrhea, unspecified: Secondary | ICD-10-CM | POA: Diagnosis not present

## 2023-01-21 DIAGNOSIS — E118 Type 2 diabetes mellitus with unspecified complications: Secondary | ICD-10-CM | POA: Diagnosis not present

## 2023-01-21 DIAGNOSIS — Z6841 Body Mass Index (BMI) 40.0 and over, adult: Secondary | ICD-10-CM | POA: Diagnosis not present

## 2023-02-17 DIAGNOSIS — M1712 Unilateral primary osteoarthritis, left knee: Secondary | ICD-10-CM | POA: Diagnosis not present

## 2023-02-19 DIAGNOSIS — M170 Bilateral primary osteoarthritis of knee: Secondary | ICD-10-CM | POA: Diagnosis not present

## 2023-03-09 DIAGNOSIS — E118 Type 2 diabetes mellitus with unspecified complications: Secondary | ICD-10-CM | POA: Diagnosis not present

## 2023-03-09 DIAGNOSIS — Z6841 Body Mass Index (BMI) 40.0 and over, adult: Secondary | ICD-10-CM | POA: Diagnosis not present

## 2023-03-23 ENCOUNTER — Ambulatory Visit: Payer: PPO | Admitting: Internal Medicine

## 2023-03-24 DIAGNOSIS — I10 Essential (primary) hypertension: Secondary | ICD-10-CM | POA: Diagnosis not present

## 2023-03-24 DIAGNOSIS — K219 Gastro-esophageal reflux disease without esophagitis: Secondary | ICD-10-CM | POA: Diagnosis not present

## 2023-03-24 DIAGNOSIS — Z6841 Body Mass Index (BMI) 40.0 and over, adult: Secondary | ICD-10-CM | POA: Diagnosis not present

## 2023-03-24 DIAGNOSIS — E119 Type 2 diabetes mellitus without complications: Secondary | ICD-10-CM | POA: Diagnosis not present

## 2023-03-24 DIAGNOSIS — I700 Atherosclerosis of aorta: Secondary | ICD-10-CM | POA: Diagnosis not present

## 2023-03-24 DIAGNOSIS — F325 Major depressive disorder, single episode, in full remission: Secondary | ICD-10-CM | POA: Diagnosis not present

## 2023-03-24 DIAGNOSIS — E1159 Type 2 diabetes mellitus with other circulatory complications: Secondary | ICD-10-CM | POA: Diagnosis not present

## 2023-03-24 DIAGNOSIS — M810 Age-related osteoporosis without current pathological fracture: Secondary | ICD-10-CM | POA: Diagnosis not present

## 2023-03-24 DIAGNOSIS — E785 Hyperlipidemia, unspecified: Secondary | ICD-10-CM | POA: Diagnosis not present

## 2023-04-06 DIAGNOSIS — Z6841 Body Mass Index (BMI) 40.0 and over, adult: Secondary | ICD-10-CM | POA: Diagnosis not present

## 2023-04-06 DIAGNOSIS — E118 Type 2 diabetes mellitus with unspecified complications: Secondary | ICD-10-CM | POA: Diagnosis not present

## 2023-05-04 DIAGNOSIS — E118 Type 2 diabetes mellitus with unspecified complications: Secondary | ICD-10-CM | POA: Diagnosis not present

## 2023-05-04 DIAGNOSIS — Z6841 Body Mass Index (BMI) 40.0 and over, adult: Secondary | ICD-10-CM | POA: Diagnosis not present

## 2023-05-06 DIAGNOSIS — D3132 Benign neoplasm of left choroid: Secondary | ICD-10-CM | POA: Diagnosis not present

## 2023-05-06 DIAGNOSIS — Z961 Presence of intraocular lens: Secondary | ICD-10-CM | POA: Diagnosis not present

## 2023-05-06 DIAGNOSIS — E119 Type 2 diabetes mellitus without complications: Secondary | ICD-10-CM | POA: Diagnosis not present

## 2023-05-06 DIAGNOSIS — H43811 Vitreous degeneration, right eye: Secondary | ICD-10-CM | POA: Diagnosis not present

## 2023-06-01 DIAGNOSIS — E118 Type 2 diabetes mellitus with unspecified complications: Secondary | ICD-10-CM | POA: Diagnosis not present

## 2023-06-01 DIAGNOSIS — Z6841 Body Mass Index (BMI) 40.0 and over, adult: Secondary | ICD-10-CM | POA: Diagnosis not present

## 2023-06-14 DIAGNOSIS — R1011 Right upper quadrant pain: Secondary | ICD-10-CM | POA: Diagnosis not present

## 2023-06-14 DIAGNOSIS — R140 Abdominal distension (gaseous): Secondary | ICD-10-CM | POA: Diagnosis not present

## 2023-06-24 DIAGNOSIS — Z6841 Body Mass Index (BMI) 40.0 and over, adult: Secondary | ICD-10-CM | POA: Diagnosis not present

## 2023-06-24 DIAGNOSIS — E118 Type 2 diabetes mellitus with unspecified complications: Secondary | ICD-10-CM | POA: Diagnosis not present

## 2023-07-20 DIAGNOSIS — Z6841 Body Mass Index (BMI) 40.0 and over, adult: Secondary | ICD-10-CM | POA: Diagnosis not present

## 2023-07-20 DIAGNOSIS — E118 Type 2 diabetes mellitus with unspecified complications: Secondary | ICD-10-CM | POA: Diagnosis not present

## 2023-07-22 DIAGNOSIS — R197 Diarrhea, unspecified: Secondary | ICD-10-CM | POA: Diagnosis not present

## 2023-07-22 DIAGNOSIS — R110 Nausea: Secondary | ICD-10-CM | POA: Diagnosis not present

## 2023-07-22 DIAGNOSIS — R140 Abdominal distension (gaseous): Secondary | ICD-10-CM | POA: Diagnosis not present

## 2023-07-22 DIAGNOSIS — R142 Eructation: Secondary | ICD-10-CM | POA: Diagnosis not present

## 2023-07-22 DIAGNOSIS — R1084 Generalized abdominal pain: Secondary | ICD-10-CM | POA: Diagnosis not present

## 2023-07-22 DIAGNOSIS — R109 Unspecified abdominal pain: Secondary | ICD-10-CM | POA: Diagnosis not present

## 2023-07-22 DIAGNOSIS — R634 Abnormal weight loss: Secondary | ICD-10-CM | POA: Diagnosis not present

## 2023-08-03 DIAGNOSIS — I700 Atherosclerosis of aorta: Secondary | ICD-10-CM | POA: Diagnosis not present

## 2023-08-03 DIAGNOSIS — D519 Vitamin B12 deficiency anemia, unspecified: Secondary | ICD-10-CM | POA: Diagnosis not present

## 2023-08-03 DIAGNOSIS — E1165 Type 2 diabetes mellitus with hyperglycemia: Secondary | ICD-10-CM | POA: Diagnosis not present

## 2023-08-03 DIAGNOSIS — E559 Vitamin D deficiency, unspecified: Secondary | ICD-10-CM | POA: Diagnosis not present

## 2023-08-03 DIAGNOSIS — E1169 Type 2 diabetes mellitus with other specified complication: Secondary | ICD-10-CM | POA: Diagnosis not present

## 2023-08-03 DIAGNOSIS — K760 Fatty (change of) liver, not elsewhere classified: Secondary | ICD-10-CM | POA: Diagnosis not present

## 2023-08-03 DIAGNOSIS — I739 Peripheral vascular disease, unspecified: Secondary | ICD-10-CM | POA: Diagnosis not present

## 2023-08-17 DIAGNOSIS — E118 Type 2 diabetes mellitus with unspecified complications: Secondary | ICD-10-CM | POA: Diagnosis not present

## 2023-08-17 DIAGNOSIS — E785 Hyperlipidemia, unspecified: Secondary | ICD-10-CM | POA: Diagnosis not present

## 2023-08-17 DIAGNOSIS — E119 Type 2 diabetes mellitus without complications: Secondary | ICD-10-CM | POA: Diagnosis not present

## 2023-08-17 DIAGNOSIS — I10 Essential (primary) hypertension: Secondary | ICD-10-CM | POA: Diagnosis not present

## 2023-08-17 DIAGNOSIS — M810 Age-related osteoporosis without current pathological fracture: Secondary | ICD-10-CM | POA: Diagnosis not present

## 2023-08-17 DIAGNOSIS — G250 Essential tremor: Secondary | ICD-10-CM | POA: Diagnosis not present

## 2023-08-17 DIAGNOSIS — Z6841 Body Mass Index (BMI) 40.0 and over, adult: Secondary | ICD-10-CM | POA: Diagnosis not present

## 2023-08-17 DIAGNOSIS — K760 Fatty (change of) liver, not elsewhere classified: Secondary | ICD-10-CM | POA: Diagnosis not present

## 2023-08-18 ENCOUNTER — Other Ambulatory Visit: Payer: Self-pay | Admitting: Family Medicine

## 2023-08-18 DIAGNOSIS — K760 Fatty (change of) liver, not elsewhere classified: Secondary | ICD-10-CM

## 2023-08-27 ENCOUNTER — Ambulatory Visit
Admission: RE | Admit: 2023-08-27 | Discharge: 2023-08-27 | Disposition: A | Discharge status: Home / Self Care | Payer: PPO | Source: Ambulatory Visit | Attending: Family Medicine | Admitting: Family Medicine

## 2023-08-27 DIAGNOSIS — K760 Fatty (change of) liver, not elsewhere classified: Secondary | ICD-10-CM | POA: Diagnosis not present

## 2023-09-16 DIAGNOSIS — Z6841 Body Mass Index (BMI) 40.0 and over, adult: Secondary | ICD-10-CM | POA: Diagnosis not present

## 2023-09-16 DIAGNOSIS — E118 Type 2 diabetes mellitus with unspecified complications: Secondary | ICD-10-CM | POA: Diagnosis not present

## 2023-10-19 DIAGNOSIS — Z23 Encounter for immunization: Secondary | ICD-10-CM | POA: Diagnosis not present

## 2023-10-19 DIAGNOSIS — M810 Age-related osteoporosis without current pathological fracture: Secondary | ICD-10-CM | POA: Diagnosis not present

## 2023-10-19 DIAGNOSIS — I10 Essential (primary) hypertension: Secondary | ICD-10-CM | POA: Diagnosis not present

## 2023-10-19 DIAGNOSIS — E785 Hyperlipidemia, unspecified: Secondary | ICD-10-CM | POA: Diagnosis not present

## 2023-10-19 DIAGNOSIS — Z0000 Encounter for general adult medical examination without abnormal findings: Secondary | ICD-10-CM | POA: Diagnosis not present

## 2023-10-19 DIAGNOSIS — E1142 Type 2 diabetes mellitus with diabetic polyneuropathy: Secondary | ICD-10-CM | POA: Diagnosis not present

## 2023-10-28 DIAGNOSIS — E118 Type 2 diabetes mellitus with unspecified complications: Secondary | ICD-10-CM | POA: Diagnosis not present

## 2023-10-28 DIAGNOSIS — Z6841 Body Mass Index (BMI) 40.0 and over, adult: Secondary | ICD-10-CM | POA: Diagnosis not present

## 2023-11-02 DIAGNOSIS — I10 Essential (primary) hypertension: Secondary | ICD-10-CM | POA: Diagnosis not present

## 2023-11-02 DIAGNOSIS — K760 Fatty (change of) liver, not elsewhere classified: Secondary | ICD-10-CM | POA: Diagnosis not present

## 2023-11-02 DIAGNOSIS — M810 Age-related osteoporosis without current pathological fracture: Secondary | ICD-10-CM | POA: Diagnosis not present

## 2023-11-02 DIAGNOSIS — E1165 Type 2 diabetes mellitus with hyperglycemia: Secondary | ICD-10-CM | POA: Diagnosis not present

## 2023-11-02 DIAGNOSIS — E1169 Type 2 diabetes mellitus with other specified complication: Secondary | ICD-10-CM | POA: Diagnosis not present

## 2023-12-21 DIAGNOSIS — H40013 Open angle with borderline findings, low risk, bilateral: Secondary | ICD-10-CM | POA: Diagnosis not present

## 2023-12-21 DIAGNOSIS — E119 Type 2 diabetes mellitus without complications: Secondary | ICD-10-CM | POA: Diagnosis not present

## 2024-01-25 DIAGNOSIS — Z6841 Body Mass Index (BMI) 40.0 and over, adult: Secondary | ICD-10-CM | POA: Diagnosis not present

## 2024-01-25 DIAGNOSIS — E118 Type 2 diabetes mellitus with unspecified complications: Secondary | ICD-10-CM | POA: Diagnosis not present

## 2024-02-01 DIAGNOSIS — K760 Fatty (change of) liver, not elsewhere classified: Secondary | ICD-10-CM | POA: Diagnosis not present

## 2024-02-01 DIAGNOSIS — I700 Atherosclerosis of aorta: Secondary | ICD-10-CM | POA: Diagnosis not present

## 2024-02-01 DIAGNOSIS — E1165 Type 2 diabetes mellitus with hyperglycemia: Secondary | ICD-10-CM | POA: Diagnosis not present

## 2024-02-01 DIAGNOSIS — E559 Vitamin D deficiency, unspecified: Secondary | ICD-10-CM | POA: Diagnosis not present

## 2024-02-01 DIAGNOSIS — E1169 Type 2 diabetes mellitus with other specified complication: Secondary | ICD-10-CM | POA: Diagnosis not present

## 2024-02-01 DIAGNOSIS — D519 Vitamin B12 deficiency anemia, unspecified: Secondary | ICD-10-CM | POA: Diagnosis not present

## 2024-02-01 DIAGNOSIS — I10 Essential (primary) hypertension: Secondary | ICD-10-CM | POA: Diagnosis not present

## 2024-03-09 DIAGNOSIS — Z6841 Body Mass Index (BMI) 40.0 and over, adult: Secondary | ICD-10-CM | POA: Diagnosis not present

## 2024-03-09 DIAGNOSIS — E118 Type 2 diabetes mellitus with unspecified complications: Secondary | ICD-10-CM | POA: Diagnosis not present

## 2024-04-13 DIAGNOSIS — Z6841 Body Mass Index (BMI) 40.0 and over, adult: Secondary | ICD-10-CM | POA: Diagnosis not present

## 2024-04-13 DIAGNOSIS — E118 Type 2 diabetes mellitus with unspecified complications: Secondary | ICD-10-CM | POA: Diagnosis not present

## 2024-04-17 DIAGNOSIS — M170 Bilateral primary osteoarthritis of knee: Secondary | ICD-10-CM | POA: Diagnosis not present

## 2024-04-18 DIAGNOSIS — I10 Essential (primary) hypertension: Secondary | ICD-10-CM | POA: Diagnosis not present

## 2024-04-18 DIAGNOSIS — M810 Age-related osteoporosis without current pathological fracture: Secondary | ICD-10-CM | POA: Diagnosis not present

## 2024-05-02 DIAGNOSIS — M546 Pain in thoracic spine: Secondary | ICD-10-CM | POA: Diagnosis not present

## 2024-05-03 DIAGNOSIS — I739 Peripheral vascular disease, unspecified: Secondary | ICD-10-CM | POA: Diagnosis not present

## 2024-05-03 DIAGNOSIS — I700 Atherosclerosis of aorta: Secondary | ICD-10-CM | POA: Diagnosis not present

## 2024-05-03 DIAGNOSIS — E118 Type 2 diabetes mellitus with unspecified complications: Secondary | ICD-10-CM | POA: Diagnosis not present

## 2024-05-03 DIAGNOSIS — M810 Age-related osteoporosis without current pathological fracture: Secondary | ICD-10-CM | POA: Diagnosis not present

## 2024-05-03 DIAGNOSIS — K760 Fatty (change of) liver, not elsewhere classified: Secondary | ICD-10-CM | POA: Diagnosis not present

## 2024-05-03 DIAGNOSIS — I10 Essential (primary) hypertension: Secondary | ICD-10-CM | POA: Diagnosis not present

## 2024-05-03 DIAGNOSIS — E559 Vitamin D deficiency, unspecified: Secondary | ICD-10-CM | POA: Diagnosis not present

## 2024-05-03 DIAGNOSIS — E1169 Type 2 diabetes mellitus with other specified complication: Secondary | ICD-10-CM | POA: Diagnosis not present

## 2024-05-03 DIAGNOSIS — I4892 Unspecified atrial flutter: Secondary | ICD-10-CM | POA: Diagnosis not present

## 2024-05-07 ENCOUNTER — Emergency Department (HOSPITAL_COMMUNITY)

## 2024-05-07 ENCOUNTER — Emergency Department (HOSPITAL_COMMUNITY)
Admission: EM | Admit: 2024-05-07 | Discharge: 2024-05-07 | Disposition: A | Discharge status: Home / Self Care | Attending: Emergency Medicine | Admitting: Emergency Medicine

## 2024-05-07 DIAGNOSIS — E119 Type 2 diabetes mellitus without complications: Secondary | ICD-10-CM | POA: Insufficient documentation

## 2024-05-07 DIAGNOSIS — R0789 Other chest pain: Secondary | ICD-10-CM | POA: Diagnosis not present

## 2024-05-07 DIAGNOSIS — I10 Essential (primary) hypertension: Secondary | ICD-10-CM | POA: Insufficient documentation

## 2024-05-07 DIAGNOSIS — I700 Atherosclerosis of aorta: Secondary | ICD-10-CM | POA: Diagnosis not present

## 2024-05-07 DIAGNOSIS — G4730 Sleep apnea, unspecified: Secondary | ICD-10-CM | POA: Insufficient documentation

## 2024-05-07 DIAGNOSIS — H547 Unspecified visual loss: Secondary | ICD-10-CM | POA: Diagnosis not present

## 2024-05-07 DIAGNOSIS — R739 Hyperglycemia, unspecified: Secondary | ICD-10-CM | POA: Diagnosis not present

## 2024-05-07 DIAGNOSIS — Z7901 Long term (current) use of anticoagulants: Secondary | ICD-10-CM | POA: Insufficient documentation

## 2024-05-07 DIAGNOSIS — R079 Chest pain, unspecified: Secondary | ICD-10-CM | POA: Diagnosis not present

## 2024-05-07 DIAGNOSIS — Z79899 Other long term (current) drug therapy: Secondary | ICD-10-CM | POA: Insufficient documentation

## 2024-05-07 DIAGNOSIS — R42 Dizziness and giddiness: Secondary | ICD-10-CM | POA: Diagnosis not present

## 2024-05-07 DIAGNOSIS — M549 Dorsalgia, unspecified: Secondary | ICD-10-CM | POA: Diagnosis not present

## 2024-05-07 DIAGNOSIS — I672 Cerebral atherosclerosis: Secondary | ICD-10-CM | POA: Diagnosis not present

## 2024-05-07 DIAGNOSIS — R0602 Shortness of breath: Secondary | ICD-10-CM | POA: Insufficient documentation

## 2024-05-07 DIAGNOSIS — I771 Stricture of artery: Secondary | ICD-10-CM | POA: Diagnosis not present

## 2024-05-07 LAB — BASIC METABOLIC PANEL WITH GFR
Anion gap: 10 (ref 5–15)
BUN: 19 mg/dL (ref 8–23)
CO2: 24 mmol/L (ref 22–32)
Calcium: 8.9 mg/dL (ref 8.9–10.3)
Chloride: 102 mmol/L (ref 98–111)
Creatinine, Ser: 0.75 mg/dL (ref 0.44–1.00)
GFR, Estimated: >60 mL/min (ref >60)
Glucose, Bld: 235 mg/dL — ABNORMAL HIGH (ref 70–99)
Potassium: 4.7 mmol/L (ref 3.5–5.1)
Sodium: 136 mmol/L (ref 135–145)

## 2024-05-07 LAB — TROPONIN I (HIGH SENSITIVITY)
Troponin I (High Sensitivity): 6 ng/L (ref <18)
Troponin I (High Sensitivity): 7 ng/L (ref <18)

## 2024-05-07 LAB — CBC WITH DIFFERENTIAL/PLATELET
Abs Immature Granulocytes: 0.07 10*3/uL (ref 0.00–0.07)
Basophils Absolute: 0 10*3/uL (ref 0.0–0.1)
Basophils Relative: 0 %
Eosinophils Absolute: 0 10*3/uL (ref 0.0–0.5)
Eosinophils Relative: 0 %
HCT: 39 % (ref 36.0–46.0)
Hemoglobin: 12 g/dL (ref 12.0–15.0)
Immature Granulocytes: 1 %
Lymphocytes Relative: 15 %
Lymphs Abs: 1.5 10*3/uL (ref 0.7–4.0)
MCH: 25.7 pg — ABNORMAL LOW (ref 26.0–34.0)
MCHC: 30.8 g/dL (ref 30.0–36.0)
MCV: 83.5 fL (ref 80.0–100.0)
Monocytes Absolute: 0.5 10*3/uL (ref 0.1–1.0)
Monocytes Relative: 5 %
Neutro Abs: 7.9 10*3/uL — ABNORMAL HIGH (ref 1.7–7.7)
Neutrophils Relative %: 79 %
Platelets: 326 10*3/uL (ref 150–400)
RBC: 4.67 MIL/uL (ref 3.87–5.11)
RDW: 16 % — ABNORMAL HIGH (ref 11.5–15.5)
WBC: 10.1 10*3/uL (ref 4.0–10.5)
nRBC: 0 % (ref 0.0–0.2)

## 2024-05-07 LAB — BRAIN NATRIURETIC PEPTIDE: B Natriuretic Peptide: 78.6 pg/mL (ref 0.0–100.0)

## 2024-05-07 NOTE — ED Provider Notes (Signed)
 [place] [facility]GENC[facility] DE[facility]MENT AT [facility] Provider Note   CSN: [id number] Arrival date & time: 05/07/24  1701     Patient presents with: Shortness of Breath   Heidi Byrd is a 78 y.o. female.   78 y.o female with a PMH of HTN, Depression, DM presents to the ED via EMS with a chief complaint of shortness of breath and chest pain.  Patient reports she was taking a nap when suddenly she woke up from her nap, felt that everything was black , she felt impending doom .  Reports she felt like she could not actually dial the phone number to 911, reports she Dialing 0, she felt like the end was near.  This later subsided.  She received 324 mg of aspirin with EMS.  Reports that shortness of breath has been ongoing for some time, however feels more short of breath than before.  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  She currently recently started prednisone to help with muscle spasms, does not wear any oxygen at home but had 2 L nasal cannula by EMS to help with her breathing.  Denies any headache but states that she had a really bad headache yesterday.  No cough, no fevers, no leg swelling.  The history is provided by the patient.  Shortness of Breath Associated symptoms: no abdominal pain, no chest pain, no fever, no sore throat and no vomiting        Prior to Admission medications   Medication Sig Start Date End Date Taking? Authorizing Provider  apixaban  (ELIQUIS ) 5 MG TABS tablet Take 1 tablet (5 mg total) by mouth 2 (two) times daily. 09/21/22   Alvan Ronal BRAVO, MD  cetirizine (ZYRTEC) 10 MG tablet Take 10 mg by mouth daily as needed for allergies.    [provider]  Cholecalciferol (VITAMIN D-3) 5000 units TABS Take 5,000 Units by mouth daily.    [provider]  losartan  (COZAAR ) 50 MG tablet Take 50 mg by mouth daily. 07/31/20    [provider]  lovastatin (MEVACOR) 10 MG tablet Take 10 mg by mouth every evening. 07/31/20   [provider]  metoprolol  succinate (TOPROL -XL) 50 MG 24 hr tablet Take 75 mg by mouth daily.  03/12/16   [provider]  metoprolol  tartrate (LOPRESSOR ) 50 MG tablet Take 1 tablet (50 mg total) by mouth once for 1 dose. PLEASE TAKE METOPROLOL  2  HOURS PRIOR TO CTA SCAN. 09/21/22 09/21/22  Alvan Ronal BRAVO, MD  nystatin powder Nystatin    [provider]  oxymetazoline (AFRIN NASAL SPRAY) 0.05 % nasal spray Place 1 spray into both nostrils 2 (two) times daily. 09/24/20   Buccini, Lamar, MD  pantoprazole  (PROTONIX ) 40 MG tablet Take 40 mg by mouth 2 (two) times daily before a meal.  09/02/20   [provider]    Allergies: Tetracyclines & related, Augmentin [amoxicillin-pot clavulanate], Calcium channel blockers, Codeine, Hydrocodone-acetaminophen , Other, Primidone , and Wellbutrin [bupropion]    Review of Systems  Constitutional:  Negative for chills and fever.  HENT:  Negative for sore throat.   Respiratory:  Positive for shortness of breath.   Cardiovascular:  Negative for chest pain and leg swelling.  Gastrointestinal:  Negative for abdominal pain, nausea and vomiting.  Genitourinary:  Negative for flank pain.  Musculoskeletal:  Negative for back pain.  Neurological:  Positive for  dizziness and light-headedness. Negative for facial asymmetry.  All other systems reviewed and are negative.   Updated Vital Signs BP (!) 145/64   Pulse 77   Temp 97.9 F (36.6 C) (Oral)   Resp 20   SpO2 99%   Physical Exam Vitals and nursing note reviewed.  Constitutional:      Appearance: She is well-developed.  HENT:     Head: Normocephalic and atraumatic.   Cardiovascular:     Rate and Rhythm: Normal rate.     Comments: NO BLLE edema Pulmonary:     Effort: Pulmonary effort is normal.     Breath sounds: No decreased breath sounds.     Comments: Lungs  are clear, no wheezing or rales Chest:     Chest wall: No tenderness.  Abdominal:     Palpations: Abdomen is soft.     Tenderness: There is no abdominal tenderness.   Musculoskeletal:     Right lower leg: No edema.     Left lower leg: No edema.   Skin:    General: Skin is warm and dry.   Neurological:     Mental Status: She is alert and oriented to person, place, and time.     Comments: Alert, oriented, thought content appropriate. Speech fluent without evidence of aphasia. Able to follow 2 step commands without difficulty.  Cranial Nerves:  II:  Peripheral visual fields grossly normal, pupils, round, reactive to light III,IV, VI: ptosis not present, extra-ocular motions intact bilaterally  V,VII: smile symmetric, facial light touch sensation equal VIII: hearing grossly normal bilaterally  IX,X: midline uvula rise  XI: bilateral shoulder shrug equal and strong XII: midline tongue extension  Motor:  5/5 in upper and lower extremities bilaterally including strong and equal grip strength and dorsiflexion/plantar flexion Sensory: light touch normal in all extremities.  Cerebellar: normal finger-to-nose with bilateral upper extremities, pronator drift negative Gait: normal gait and balance      (all labs ordered are listed, but only abnormal results are displayed) Labs Reviewed  BASIC METABOLIC PANEL WITH GFR - Abnormal; Notable for the following components:      Result Value   Glucose, Bld 235 (*)    All other components within normal limits  CBC WITH DIFFERENTIAL/PLATELET - Abnormal; Notable for the following components:   MCH 25.7 (*)    RDW 16.0 (*)    Neutro Abs 7.9 (*)    All other components within normal limits  BRAIN NATRIURETIC PEPTIDE  TROPONIN I (HIGH SENSITIVITY)  TROPONIN I (HIGH SENSITIVITY)    EKG: EKG Interpretation Date/Time:  Sunday May 07 2024 17:24:50 EDT Ventricular Rate:  86 PR Interval:  168 QRS Duration:  85 QT Interval:  348 QTC  Calculation: 417 R Axis:   50  Text Interpretation: Sinus rhythm Consider left atrial enlargement Confirmed by Pamella Sharper [phone]) on 05/07/2024 8:00:35 PM  Radiology: CT HEAD WO CONTRAST ( ) Result Date: 05/07/2024 CLINICAL DATA:  Syncope/presyncope, cerebrovascular cause suspected EXAM: CT HEAD WITHOUT CONTRAST TECHNIQUE: Contiguous axial images were obtained from the base of the skull through the vertex without intravenous contrast. RADIATION DOSE REDUCTION: This exam was performed according to the departmental dose-optimization program which includes automated exposure control, adjustment of the mA and/or kV according to patient size and/or use of iterative reconstruction technique. COMPARISON:  None Available. FINDINGS: Brain: No intracranial hemorrhage, mass effect, or midline shift. No hydrocephalus. The basilar cisterns are patent. No evidence of territorial infarct or acute ischemia. No extra-axial or intracranial fluid collection.  Vascular: Atherosclerosis of skullbase vasculature without hyperdense vessel or abnormal calcification. Skull: No fracture or focal lesion. Sinuses/Orbits: Paranasal sinuses and mastoid air cells are clear. The visualized orbits are unremarkable. Other: None. IMPRESSION: No acute intracranial abnormality. Electronically Signed   By: Andrea Gasman M.D.   On: 05/07/2024 19:06   DG Chest 2 View Result Date: 05/07/2024 CLINICAL DATA:  sob and chest pain EXAM: CHEST - 2 VIEW COMPARISON:  June 24, 2020 FINDINGS: No focal airspace consolidation, pleural effusion, or pneumothorax. Mild cardiomegaly. Tortuous aorta with aortic atherosclerosis. No acute fracture or destructive lesions. Multilevel thoracic osteophytosis. IMPRESSION: No acute cardiopulmonary abnormality. Electronically Signed   By: Rogelia Myers M.D.   On: 05/07/2024 18:21     Procedures   Medications Ordered in the ED - No data to display                                  Medical Decision  Making Amount and/or Complexity of Data Reviewed Labs: ordered. Radiology: ordered.   This patient presents to the ED for concern of sob, this involves a number of treatment options, and is a complaint that carries with it a high risk of complications and morbidity.  The differential diagnosis includes ACS, infection versus PE.    Co morbidities: Discussed in HPI   Brief History:  See HPI/   EMR reviewed including pt PMHx, past surgical history and past visits to ER.   See HPI for more details   Lab Tests:  I ordered and independently interpreted labs.  The pertinent results include:    I personally reviewed all laboratory work and imaging. Metabolic panel without any acute abnormality specifically kidney function within normal limits and no significant electrolyte abnormalities. CBC without leukocytosis or significant anemia.   Imaging Studies:  NAD. I personally reviewed all imaging studies and no acute abnormality found. I agree with radiology interpretation. Chest x-ray without any signs of infection, or pulmonary edema.  Cardiac Monitoring:  The patient was maintained on a cardiac monitor.  I personally viewed and interpreted the cardiac monitored which showed an underlying rhythm of: NSR 86 EKG non-ischemic   Medicines ordered:  N/A   Reevaluation:  After the interventions noted above I re-evaluated patient and found that they have :improved  Social Determinants of Health:  The patient's social determinants of health were a factor in the care of this patient  Problem List / ED Course:  Patient presented to the ED after an episode of impending doom , reports she was waking up from a nap and also that she felt very disoriented, felt like she could not talk straight, reports that she reach for the phone in pressed 0 multiple times thinking she was trying to call 911.  She felt very confused and drowsy.  He also endorses shortness of breath, however this has  been an ongoing thing for her, does not wear any oxygen at home.  She does endorse a history of sleep apnea, but does not wear her CPAP at night as she reports she is claustrophobic.  She is currently anticoagulated on Eliquis , reports compliance with all her medications.  She did have some aspirin by EMS, does report improvement in her symptoms.  When she arrived here her vitals are within normal limits, normotensive, no fever, no hypoxia or tachycardia.  Return for lab work reveals CBC with no leukocytosis, hemoglobin is within normal limits.  BMP  with no electrolyte derangement, creatinine levels unremarkable.  BNP is within normal limits, chest x-ray does not show any pulmonary edema, she does not appear volume overloaded on exam.  Troponin first 1 is 6, delta is currently pending.  She does not endorse any chest pain, does have shortness of breath but reports this is not out of her norm.  CT of her head did not show any intracranial pathology.  She does tell me that she was recently started on medication such as prednisone and baclofen, she does feel like perhaps some of these medications are causing some of the symptoms.  She did take her steroid this morning, but does usually take her baclofen around the afternoon.  I do consider some adverse effects from the baclofen such as confusion, and dizziness which she has had she describes as episode.  She tells me she did not lose consciousness during this episode, no prior history of seizures, she does have prior history of perimenopausal absent episodes where she was just stare blank into space, reports these have not happened in over 30 years, low suspicion for seizure like activity.  Anticoagulated on eliquis , low suspicion for PE with no chest pain or worsen SOB than her baseline.  EKG is normal sinus rhythm, does not have any chest pain, no signs of respiratory distress.  No signs of infection thus far on the workup.  I do not feel that any further  emergent workup is needed at this time.  Discussed this case with my attending Dr. Pamella who is also evaluated patient. Patient will follow-up with PCP, will discontinue baclofen until she discusses this with him in the morning.  Agreeable to plan treatment, return precautions discussed at length.   Nervous system: Asthenia (<=15%), confusion (<=11%), dizziness (2% to 15%), drowsiness (<=63%), headache (2% to 11%), hypotonia (2% to 35%)  Dispostion:  After consideration of the diagnostic results and the patients response to treatment, I feel that the patent would benefit from discontinue baclofen, follow-up with her primary care physician.   Portions of this note were generated with Scientist, clinical (histocompatibility and immunogenetics). Dictation errors may occur despite best attempts at proofreading.      Final diagnoses:  Shortness of breath    ED Discharge Orders     None          Maureen Broad, PA-C 05/07/24 2104    Pamella Ozell LABOR, DO 05/10/24 2041

## 2024-05-07 NOTE — Discharge Instructions (Addendum)
 Your laboratories also within normal limits today.  Please follow-up with your primary care physician as needed.

## 2024-05-07 NOTE — ED Triage Notes (Signed)
 Pt bib ems with CP and sob since waking up. Took a nap then woke up to black vision that last approx. 15 seconds with cp. Intermittent CP. 324 ASA, 2lpm via [facility].

## 2024-05-07 NOTE — ED Notes (Signed)
 First trop collected @1901  second one due @2101 

## 2024-05-10 DIAGNOSIS — R0602 Shortness of breath: Secondary | ICD-10-CM | POA: Diagnosis not present

## 2024-05-10 DIAGNOSIS — M546 Pain in thoracic spine: Secondary | ICD-10-CM | POA: Diagnosis not present

## 2024-05-10 DIAGNOSIS — G4730 Sleep apnea, unspecified: Secondary | ICD-10-CM | POA: Diagnosis not present

## 2024-05-10 DIAGNOSIS — M25511 Pain in right shoulder: Secondary | ICD-10-CM | POA: Diagnosis not present

## 2024-05-10 DIAGNOSIS — R410 Disorientation, unspecified: Secondary | ICD-10-CM | POA: Diagnosis not present

## 2024-05-16 DIAGNOSIS — M546 Pain in thoracic spine: Secondary | ICD-10-CM | POA: Diagnosis not present

## 2024-05-16 DIAGNOSIS — M25511 Pain in right shoulder: Secondary | ICD-10-CM | POA: Diagnosis not present

## 2024-05-18 DIAGNOSIS — M25511 Pain in right shoulder: Secondary | ICD-10-CM | POA: Diagnosis not present

## 2024-05-18 DIAGNOSIS — M546 Pain in thoracic spine: Secondary | ICD-10-CM | POA: Diagnosis not present

## 2024-05-23 DIAGNOSIS — M25511 Pain in right shoulder: Secondary | ICD-10-CM | POA: Diagnosis not present

## 2024-05-23 DIAGNOSIS — M546 Pain in thoracic spine: Secondary | ICD-10-CM | POA: Diagnosis not present

## 2024-05-25 DIAGNOSIS — M25511 Pain in right shoulder: Secondary | ICD-10-CM | POA: Diagnosis not present

## 2024-05-25 DIAGNOSIS — M546 Pain in thoracic spine: Secondary | ICD-10-CM | POA: Diagnosis not present

## 2024-05-26 ENCOUNTER — Encounter: Payer: Self-pay | Admitting: Advanced Practice Midwife

## 2024-05-30 DIAGNOSIS — M25511 Pain in right shoulder: Secondary | ICD-10-CM | POA: Diagnosis not present

## 2024-05-30 DIAGNOSIS — M546 Pain in thoracic spine: Secondary | ICD-10-CM | POA: Diagnosis not present

## 2024-06-01 DIAGNOSIS — M25511 Pain in right shoulder: Secondary | ICD-10-CM | POA: Diagnosis not present

## 2024-06-01 DIAGNOSIS — M546 Pain in thoracic spine: Secondary | ICD-10-CM | POA: Diagnosis not present

## 2024-06-06 DIAGNOSIS — M25511 Pain in right shoulder: Secondary | ICD-10-CM | POA: Diagnosis not present

## 2024-06-06 DIAGNOSIS — M546 Pain in thoracic spine: Secondary | ICD-10-CM | POA: Diagnosis not present

## 2024-06-08 DIAGNOSIS — M25511 Pain in right shoulder: Secondary | ICD-10-CM | POA: Diagnosis not present

## 2024-06-08 DIAGNOSIS — M546 Pain in thoracic spine: Secondary | ICD-10-CM | POA: Diagnosis not present

## 2024-06-21 DIAGNOSIS — H40013 Open angle with borderline findings, low risk, bilateral: Secondary | ICD-10-CM | POA: Diagnosis not present

## 2024-06-22 DIAGNOSIS — M25511 Pain in right shoulder: Secondary | ICD-10-CM | POA: Diagnosis not present

## 2024-06-22 DIAGNOSIS — M546 Pain in thoracic spine: Secondary | ICD-10-CM | POA: Diagnosis not present

## 2024-07-04 DIAGNOSIS — M65341 Trigger finger, right ring finger: Secondary | ICD-10-CM | POA: Diagnosis not present

## 2024-07-04 DIAGNOSIS — M546 Pain in thoracic spine: Secondary | ICD-10-CM | POA: Diagnosis not present

## 2024-07-04 DIAGNOSIS — M545 Low back pain, unspecified: Secondary | ICD-10-CM | POA: Diagnosis not present

## 2024-07-14 DIAGNOSIS — M546 Pain in thoracic spine: Secondary | ICD-10-CM | POA: Diagnosis not present

## 2024-07-19 DIAGNOSIS — I10 Essential (primary) hypertension: Secondary | ICD-10-CM | POA: Diagnosis not present

## 2024-07-19 DIAGNOSIS — Z23 Encounter for immunization: Secondary | ICD-10-CM | POA: Diagnosis not present

## 2024-07-19 DIAGNOSIS — R600 Localized edema: Secondary | ICD-10-CM | POA: Diagnosis not present

## 2024-07-26 DIAGNOSIS — I10 Essential (primary) hypertension: Secondary | ICD-10-CM | POA: Diagnosis not present

## 2024-07-26 DIAGNOSIS — R600 Localized edema: Secondary | ICD-10-CM | POA: Diagnosis not present

## 2024-08-01 DIAGNOSIS — M542 Cervicalgia: Secondary | ICD-10-CM | POA: Diagnosis not present

## 2024-08-01 DIAGNOSIS — M47816 Spondylosis without myelopathy or radiculopathy, lumbar region: Secondary | ICD-10-CM | POA: Diagnosis not present

## 2024-08-03 DIAGNOSIS — E118 Type 2 diabetes mellitus with unspecified complications: Secondary | ICD-10-CM | POA: Diagnosis not present

## 2024-08-03 DIAGNOSIS — I700 Atherosclerosis of aorta: Secondary | ICD-10-CM | POA: Diagnosis not present

## 2024-08-03 DIAGNOSIS — I739 Peripheral vascular disease, unspecified: Secondary | ICD-10-CM | POA: Diagnosis not present

## 2024-08-03 DIAGNOSIS — E559 Vitamin D deficiency, unspecified: Secondary | ICD-10-CM | POA: Diagnosis not present

## 2024-08-03 DIAGNOSIS — M810 Age-related osteoporosis without current pathological fracture: Secondary | ICD-10-CM | POA: Diagnosis not present

## 2024-08-03 DIAGNOSIS — E1169 Type 2 diabetes mellitus with other specified complication: Secondary | ICD-10-CM | POA: Diagnosis not present

## 2024-08-03 DIAGNOSIS — I10 Essential (primary) hypertension: Secondary | ICD-10-CM | POA: Diagnosis not present

## 2024-08-16 DIAGNOSIS — R600 Localized edema: Secondary | ICD-10-CM | POA: Diagnosis not present

## 2024-08-23 DIAGNOSIS — M47816 Spondylosis without myelopathy or radiculopathy, lumbar region: Secondary | ICD-10-CM | POA: Diagnosis not present

## 2024-08-25 DIAGNOSIS — R600 Localized edema: Secondary | ICD-10-CM | POA: Diagnosis not present

## 2024-08-25 DIAGNOSIS — L304 Erythema intertrigo: Secondary | ICD-10-CM | POA: Diagnosis not present

## 2024-08-29 ENCOUNTER — Encounter: Payer: Self-pay | Admitting: Internal Medicine

## 2024-08-29 ENCOUNTER — Ambulatory Visit: Attending: Internal Medicine | Admitting: Internal Medicine

## 2024-08-29 VITALS — BP 148/82 | HR 81 | Ht 62.0 in | Wt 287.0 lb

## 2024-08-29 DIAGNOSIS — I5033 Acute on chronic diastolic (congestive) heart failure: Secondary | ICD-10-CM

## 2024-08-29 DIAGNOSIS — R072 Precordial pain: Secondary | ICD-10-CM | POA: Diagnosis not present

## 2024-08-29 DIAGNOSIS — G8929 Other chronic pain: Secondary | ICD-10-CM

## 2024-08-29 DIAGNOSIS — I10 Essential (primary) hypertension: Secondary | ICD-10-CM

## 2024-08-29 MED ORDER — FUROSEMIDE 40 MG PO TABS: 40.0000 mg | ORAL_TABLET | Freq: Every day | ORAL | 3 refills | Status: AC

## 2024-08-29 MED ORDER — FUROSEMIDE 40 MG PO TABS: 40.0000 mg | ORAL_TABLET | Freq: Every day | ORAL | 3 refills | Status: DC

## 2024-08-29 NOTE — Patient Instructions (Signed)
 Medication Instructions:  INCREASE Furosemide (Lasix) to 40 mg once daily   *If you need a refill on your cardiac medications before your next appointment, please call your pharmacy*  Lab Work: To be completed in 1 week: BMP  If you have labs (blood work) drawn today and your tests are completely normal, you will receive your results only by: MyChart Message (if you have MyChart) OR A paper copy in the mail If you have any lab test that is abnormal or we need to change your treatment, we will call you to review the results.  Testing/Procedures: Your physician has requested that you have an echocardiogram at next available appointment slot. Echocardiography is a painless test that uses sound waves to create images of your heart. It provides your doctor with information about the size and shape of your heart and how well your heart's chambers and valves are working. This procedure takes approximately one hour. There are no restrictions for this procedure. Please do NOT wear cologne, perfume, aftershave, or lotions (deodorant is allowed). Please arrive 15 minutes prior to your appointment time.  Please note: We ask at that you not bring children with you during ultrasound (echo/ vascular) testing. Due to room size and safety concerns, children are not allowed in the ultrasound rooms during exams. Our front office staff cannot provide observation of children in our lobby area while testing is being conducted. An adult accompanying a patient to their appointment will only be allowed in the ultrasound room at the discretion of the ultrasound technician under special circumstances. We apologize for any inconvenience.   Follow-Up: At [facility], you and your health needs are our priority.  As part of our continuing mission to provide you with exceptional heart care, our providers are all part of one team.  This team includes your primary Cardiologist (physician) and Advanced Practice  Providers or APPs (Physician Assistants and Nurse Practitioners) who all work together to provide you with the care you need, when you need it.  Your next appointment:   2 week(s)  Provider:   One of our Advanced Practice Providers (APPs): Morse Clause, PA-C  Lamarr Satterfield, NP Miriam Shams, NP  Olivia Pavy, PA-C Josefa Beauvais, NP  Leontine Salen, PA-C Orren Fabry, PA-C  [place] Akacia Boltz, PA-C Ernest Dick, NP  Damien Braver, NP Jon Hails, PA-C  Waddell Donath, PA-C    Dayna Dunn, PA-C  Scott Weaver, PA-C Lum Louis, NP Katlyn West, NP Callie Goodrich, PA-C  Xika Zhao, NP Sheng Haley, PA-C    Kathleen Johnson, PA-C   Then, Jared E Segal, MD will plan to see you again in 6 month(s).

## 2024-08-29 NOTE — Progress Notes (Signed)
 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 Ronal Ross who presents today after referral from her PCP to do an abnormal echocardiogram and shortness of breath  She was seen by her PCP in September who noted 20 pound weight gain over a week timeframe with bilateral lower extremity pitting edema and shortness of breath.  She was started on Lasix 20 mg.  An echocardiogram was ordered but unfortunately was a poor quality scan with poor visualization of the left ventricle.  Patient states that she continues to have lower extremity swelling with significant exertional shortness of breath.  Her symptoms have slowly been improving but have not resolved despite being on Lasix for the past month.  At her last visit on 09/21/2022 with Dr. Ross she was having chest pain and underwent echocardiogram which showed diastolic dysfunction and coronary CTA which showed a coronary calcium score of 2.8.  She is currently not on Eliquis  as she did not realize she was post to take that.  Of note, she is taking ibuprofen 600 mg 3 times daily since June for low back pain which is causing her significant discomfort, distress and depression.  Regarding her depression she is not expressing a suicidal plan.   ROS:  Review of Systems  All other systems reviewed and are negative.   Physical Exam  Physical Exam Vitals and nursing note reviewed.  Constitutional:      Appearance: Normal appearance.  HENT:     Head: Normocephalic and atraumatic.  Eyes:     Conjunctiva/sclera: Conjunctivae normal.  Neck:      Vascular: No carotid bruit.  Cardiovascular:     Rate and Rhythm: Normal rate and regular rhythm.  Pulmonary:     Effort: Pulmonary effort is normal.     Breath sounds: Normal breath sounds.  Musculoskeletal:        General: No tenderness.     Right lower leg: Edema present.     Left lower leg: Edema present.  Skin:    Coloration: Skin is not jaundiced or pale.  Neurological:     Mental Status: She is alert.     VS:  BP (!) 148/82 (BP Location: Left Arm, Patient Position: Sitting, Cuff Size: Normal)   Pulse 81   Ht 5' 2 (1.575 m)   Wt 287 lb (130.2 kg)   SpO2 98%   BMI 52.49 kg/m         Wt Readings from Last 3 Encounters:  08/29/24 287 lb (130.2 kg)  09/21/22 267 lb (121.1 kg)  09/24/20 233 lb (105.7 kg)     EKG Interpretation Date/Time:    Ventricular Rate:    PR Interval:    QRS Duration:    QT Interval:    QTC Calculation:   R Axis:      Text Interpretation:      Studies Reviewed   Coronary CTA 10/05/2022: IMPRESSION: 1. Minimal mixed non-obstructive CAD, CADRADS = 1.   2. Coronary calcium score of 2.82. This was 30th percentile for age and sex matched control.   3. Normal coronary  origin with left dominance.   4. Aortic atherosclerosis.   5. Consider non-coronary causes of chest pain.   Echocardiogram 10/16/2022:   1. Left ventricular ejection fraction, by estimation, is 60 to 65%. The  left ventricle has normal function. The left ventricle has no regional  wall motion abnormalities. Left ventricular diastolic parameters are  consistent with Grade I diastolic  dysfunction (impaired relaxation).   2. Right ventricular systolic function is normal. The right ventricular  size is normal.   3. Left atrial size was mildly dilated.   4. The mitral valve is normal in structure. No evidence of mitral valve  regurgitation. No evidence of mitral stenosis.   5. The aortic valve is normal in structure. Aortic valve regurgitation is  not visualized.  No aortic stenosis is present.   6. There is borderline dilatation of the ascending aorta, measuring 38  mm.   7. The inferior vena cava is normal in size with greater than 50%  respiratory variability, suggesting right atrial pressure of 3 mmHg.    Echocardiogram 08/17/2024: Grade 1 diastolic dysfunction with poor visualization of the left ventricle Trace TR Mildly thickened pericardium  Risk Assessment/Calculations    ASCVD risk score: The ASCVD Risk score (Arnett DK, et al., 2019) failed to calculate for the following reasons:   Cannot find a previous HDL lab   Cannot find a previous total cholesterol lab   ASSESSMENT  Acute on chronic diastolic heart failure most recent echo from 08/17/2024 had very poor visualization in the left ventricle and was not done with contrast at an outside facility but did show low e' prime velocities.  Has had slow improvement on Lasix 20 mg daily. Atrial flutter likely has a very low atrial flutter burden as she only apparently has 1 EKG in the past showing atrial flutter.  EKG apparently is from June 2023 however I cannot seem to find it.  All of her other EKGs before and after have been normal sinus rhythm and she does not have any symptoms.  She has not been taking Eliquis . Hypertension typically well-controlled at home Hyperlipidemia Type 2 diabetes Obesity Jehovah's Witness Chronic pain and NSAID use for low back pain taking ibuprofen 600 mg 3 times daily since June.  Fortunately she is taking a PPI.  We had an extensive discussion on how she should not be using this any NSAIDs and should consider discussion with her PCP and/or pain management about possibly switching.  She does have a history of fatty liver disease and Tylenol  may be off the table.  Will defer to PCP and pain management.   Plan  Will increase Lasix to 40 mg daily.  Unfortunately she is not a candidate for an SGLT2 inhibitor given her history of recurrent yeast infections while  on this medication Will repeat an echocardiogram with Definity  contrast for better LV visualization Will check a BMP in 1 week and follow-up with the PA in 2 weeks for volume assessment Strongly advised to change pain regimen as this puts her at high risk of her heart failure exacerbations, worsening hypertension, renal disease and GI bleeds.  She will discuss with her PCP and pain management team Regarding her atrial flutter, given the fact that she likely has a very low atrial flutter burden as all of her EKGs have shown normal sinus rhythm, is asymptomatic and is a Jehovah's Witness on high dose of NSAIDs, will defer from restarting Eliquis  at this time as this puts her at high risk of  bleeding without the ability to get a transfusion. She is declining depression resources at this time.    Follow up: 2 weeks with APP and 6 months with me          Signed, Emeline FORBES Calender, MD

## 2024-09-06 DIAGNOSIS — R072 Precordial pain: Secondary | ICD-10-CM | POA: Diagnosis not present

## 2024-09-06 DIAGNOSIS — M25551 Pain in right hip: Secondary | ICD-10-CM | POA: Diagnosis not present

## 2024-09-06 LAB — BASIC METABOLIC PANEL WITH GFR
BUN/Creatinine Ratio: 26 (ref 12–28)
BUN: 22 mg/dL (ref 8–27)
CO2: 27 mmol/L (ref 20–29)
Calcium: 9.7 mg/dL (ref 8.7–10.3)
Chloride: 98 mmol/L (ref 96–106)
Creatinine, Ser: 0.85 mg/dL (ref 0.57–1.00)
Glucose: 94 mg/dL (ref 70–99)
Potassium: 4.7 mmol/L (ref 3.5–5.2)
Sodium: 140 mmol/L (ref 134–144)
eGFR: 71 mL/min/1.73 (ref >59)

## 2024-09-07 ENCOUNTER — Ambulatory Visit: Payer: Self-pay | Admitting: Internal Medicine

## 2024-09-15 DIAGNOSIS — M545 Low back pain, unspecified: Secondary | ICD-10-CM | POA: Diagnosis not present

## 2024-09-22 ENCOUNTER — Ambulatory Visit (HOSPITAL_COMMUNITY)
Admission: RE | Admit: 2024-09-22 | Discharge: 2024-09-22 | Disposition: A | Discharge status: Home / Self Care | Source: Ambulatory Visit | Attending: Internal Medicine | Admitting: Internal Medicine

## 2024-09-22 DIAGNOSIS — R072 Precordial pain: Secondary | ICD-10-CM | POA: Diagnosis not present

## 2024-09-22 LAB — ECHOCARDIOGRAM COMPLETE: S' Lateral: 2.73 cm

## 2024-09-22 MED ORDER — PERFLUTREN LIPID MICROSPHERE
1.0000 mL | INTRAVENOUS | Status: AC | PRN
Start: 2024-09-22 — End: 2024-09-22
  Administered 2024-09-22: 1 mL via INTRAVENOUS

## 2024-09-26 NOTE — Progress Notes (Unsigned)
 Cardiology Office Note:  .   Date:  09/27/2024  ID:  Heidi Byrd, DOB 06/03/1946, MRN [id number] PCP: Dayna Motto, DO  [facility] Providers Cardiologist:  Emeline FORBES Calender, MD {  History of Present Illness: SABRA   Heidi Byrd is a 78 y.o. female with history of hypertension, hyperlipidemia, type 2 diabetes, OSA chronic HFpEF, obesity, Jehovah's Witness.     Atrial flutter 05/06/2022 reportedly EKG showing flutter? All EKGs in our Muse system show sinus rhythm.   09/2022 started on Eliquis   Chest pain 09/2022 coronary CTA CAC score 2.82.  Social history  No history of tobacco, alcohol , drug use. Not active. No family history of CAD    Patient has reported history of atrial flutter on some EKG somewhere.  All of our EKGs demonstrate sinus rhythm, she was prescribed Eliquis  09/2022.  She was seen by new provider 08/2024 and she had not been taking Eliquis  and previously stated that she did not want to be on blood thinners.  We were also seeing patient for concerns of chronic HFpEF with associated weight gain and shortness of breath despite being on Lasix 20 mg. Lasix was increased to 40 mg and echocardiogram was repeated which demonstrated preserved EF 60 to 65%.  Today patient presents for 2-week follow-up to assess volume status.  She reports that she has had approximately 10 pound weight loss in no longer having any significant shortness of breath or peripheral edema.  Denied any orthopnea.  At baseline she is not very active and has had intermittent weight changes throughout the year but trend has been up.  She would like to lose weight but she is limited by her hip pain and back pain in which she will be evaluated soon for.  Continues to not take her Eliquis  and does not have any.  Previously states that years ago when she was started on this, it was not explained to her what her stroke risk was and the need for it so she was not taking and financially limited.  Otherwise  without any other acute complaints today.  ROS: Denies: Chest pain, shortness of breath, orthopnea, peripheral edema, palpitations, decreased exercise intolerance, fatigue, lightheadedness.   Studies Reviewed: .         Risk Assessment/Calculations:    CHA2DS2-VASc Score = 7  This indicates a 11.2% annual risk of stroke. The patient's score is based upon: CHF History: 1 HTN History: 1 Diabetes History: 1 Stroke History: 0 Vascular Disease History: 1 Age Score: 2 Gender Score: 1  Physical Exam:   VS:  BP 130/82   Pulse 82   Ht 5' 2 (1.575 m)   Wt 276 lb (125.2 kg)   SpO2 97%   BMI 50.48 kg/m    Wt Readings from Last 3 Encounters:  09/27/24 276 lb (125.2 kg)  08/29/24 287 lb (130.2 kg)  09/21/22 267 lb (121.1 kg)    GEN: Well nourished, well developed in no acute distress NECK: No JVD; No carotid bruits CARDIAC: RRR, no murmurs, rubs, gallops RESPIRATORY:  Clear to auscultation without rales, wheezing or rhonchi  ABDOMEN: Soft, non-tender, non-distended EXTREMITIES:  No edema; No deformity   ASSESSMENT AND PLAN: .    Chronic HFpEF EF 60 to 65% with mild concentric LVH.  Normal RV.  No significant valvular disease.  Euvolemic today with approximately 10 pound weight loss.  Weight today is 276 down from 287.  Happy with her current regimen. Will continue her current therapy with  Lasix 40 mg daily, losartan  50 mg mg. No SGLT2 inhibitor due to history of yeast infections while on it.  Could consider spironolactone in the future.  Atrial flutter I do not see EKG where this one-time atrial flutter was documented.  All of her EKGs since 2019 show sinus rhythm. She has expressed multiple times that she does not want to be on anticoagulation and has not been taking this.  Also financially limited.   Given lack of evidence for this we will order 30-day heart monitor to further clarify burden and then will have patient follow-up with her primary cardiologist to then discuss  whether long-term anticoagulation is appropriate for her CHA2DS2-VASc 7  Minimally elevated CAC Hyperlipidemia -09/2022 coronary CTA CAC score 2.82. No anginal complaints or equivalents. Minimal calcifications, she is not taking her lovastatin. If calcium score was greater than 100 would have stronger medications for therapy.  No history of heart attacks or strokes.  Hypertension On recheck 130/80.  Continue losartan  50 mg daily, continue Toprol -XL 50 mg.  Type 2 diabetes 6.7% 07/2024  OSA Reports having sleep study done when she was in her 30s and 40s.  She would like to work on weight loss first before repeating sleep study.  Jehovah's Witness Acknowledged.  Signed, Thom LITTIE Sluder, PA-C

## 2024-09-27 ENCOUNTER — Ambulatory Visit: Attending: Cardiology | Admitting: Cardiology

## 2024-09-27 ENCOUNTER — Encounter: Payer: Self-pay | Admitting: Cardiology

## 2024-09-27 VITALS — BP 130/82 | HR 82 | Ht 62.0 in | Wt 276.0 lb

## 2024-09-27 DIAGNOSIS — I4892 Unspecified atrial flutter: Secondary | ICD-10-CM | POA: Diagnosis not present

## 2024-09-27 DIAGNOSIS — I5032 Chronic diastolic (congestive) heart failure: Secondary | ICD-10-CM | POA: Diagnosis not present

## 2024-09-27 DIAGNOSIS — I10 Essential (primary) hypertension: Secondary | ICD-10-CM | POA: Diagnosis not present

## 2024-09-27 MED ORDER — APIXABAN 5 MG PO TABS: 5.0000 mg | ORAL_TABLET | Freq: Two times a day (BID) | ORAL | 5 refills | Status: DC

## 2024-09-27 NOTE — Patient Instructions (Signed)
 Medication Instructions:   Your physician recommends that you continue on your current medications as directed. Please refer to the Current Medication list given to you today.  *If you need a refill on your cardiac medications before your next appointment, please call your pharmacy*   Lab Work: NONE ORDERED  TODAY     If you have labs (blood work) drawn today and your tests are completely normal, you will receive your results only by: MyChart Message (if you have MyChart) OR A paper copy in the mail If you have any lab test that is abnormal or we need to change your treatment, we will call you to review the results.   Testing/Procedures: Your physician has recommended that you wear an event monitor. Event monitors are medical devices that record the heart's electrical activity. Doctors most often us  these monitors to diagnose arrhythmias. Arrhythmias are problems with the speed or rhythm of the heartbeat. The monitor is a small, portable device. You can wear one while you do your normal daily activities. This is usually used to diagnose what is causing palpitations/syncope (passing out).      Follow-Up: At [facility], you and your health needs are our priority.  As part of our continuing mission to provide you with exceptional heart care, our providers are all part of one team.  This team includes your primary Cardiologist (physician) and Advanced Practice Providers or APPs (Physician Assistants and Nurse Practitioners) who all work together to provide you with the care you need, when you need it.  Your next appointment:   6-8  week(s)   Provider:    Emeline FORBES Calender, MD     We recommend signing up for the patient portal called MyChart.  Sign up information is provided on this After Visit Summary.  MyChart is used to connect with patients for Virtual Visits (Telemedicine).  Patients are able to view lab/test results, encounter notes, upcoming appointments, etc.  Non-urgent  messages can be sent to your provider as well.   To learn more about what you can do with MyChart, go to [email or web address].   Other Instructions    [employer] Cardiac Event Monitor Instructions  Your physician has requested you wear your cardiac event monitor for __30___ days, (1-30). [employer] may call or text to confirm a shipping address. The monitor will be sent to a land address via UPS. [employer] will not ship a monitor to a PO BOX. It typically takes 3-5 days to receive your monitor after it has been enrolled. [employer] will assist with USPS tracking if your package is delayed. The telephone number for [employer] is [phone]. Once you have received your monitor, please review the enclosed instructions. Instruction tutorials can also be viewed under help and settings on the enclosed cell phone. Your monitor has already been registered assigning a specific monitor serial # to you.  Billing and Self Pay Discount Information  [employer] has been provided the insurance information we had on file for you.  If your insurance has been updated, please call [employer] at [phone] to provide them with your updated insurance information.   [employer] offers a discounted Self Pay option for patients who have insurance that does not cover their cardiac event monitor or patients without insurance.  The discounted cost of a Self Pay Cardiac Event Monitor would be $225.00 , if the patient contacts [employer] at [phone] within 7 days of applying the monitor to make payment arrangements.  If the patient does not contact [employer] within 7  days of applying the monitor, the cost of the cardiac event monitor will be $350.00.  Applying the monitor  Remove cell phone from case and turn it on. The cell phone works as it consultant and needs to be within [employer] of you at all times. The cell phone will need to be charged on a daily basis. We recommend you plug the cell phone into  the enclosed charger at your bedside table every night.  Monitor batteries: You will receive two monitor batteries labelled #1 and #2. These are your recorders. Plug battery #2 onto the second connection on the enclosed charger. Keep one battery on the charger at all times. This will keep the monitor battery deactivated. It will also keep it fully charged for when you need to switch your monitor batteries. A small light will be blinking on the battery emblem when it is charging. The light on the battery emblem will remain on when the battery is fully charged.  Open package of a Monitor strip. Insert battery #1 into black hood on strip and gently squeeze monitor battery onto connection as indicated in instruction booklet. Set aside while preparing skin.  Choose location for your strip, vertical or horizontal, as indicated in the instruction booklet. Shave to remove all hair from location. There cannot be any lotions, oils, powders, or colognes on skin where monitor is to be applied. Wipe skin clean with enclosed Saline wipe. Dry skin completely.  Peel paper labeled #1 off the back of the Monitor strip exposing the adhesive. Place the monitor on the chest in the vertical or horizontal position shown in the instruction booklet. One arrow on the monitor strip must be pointing upward. Carefully remove paper labeled #2, attaching remainder of strip to your skin. Try not to create any folds or wrinkles in the strip as you apply it.  Firmly press and release the circle in the center of the monitor battery. You will hear a small beep. This is turning the monitor battery on. The heart emblem on the monitor battery will light up every 5 seconds if the monitor battery in turned on and connected to the patient securely. Do not push and hold the circle down as this turns the monitor battery off. The cell phone will locate the monitor battery. A screen will appear on the cell phone checking the connection  of your monitor strip. This may read poor connection initially but change to good connection within the next minute. Once your monitor accepts the connection you will hear a series of 3 beeps followed by a climbing crescendo of beeps. A screen will appear on the cell phone showing the two monitor strip placement options. Touch the picture that demonstrates where you applied the monitor strip.  Your monitor strip and battery are waterproof. You are able to shower, bathe, or swim with the monitor on. They just ask you do not submerge deeper than 3 feet underwater. We recommend removing the monitor if you are swimming in a lake, river, or ocean.  Your monitor battery will need to be switched to a fully charged monitor battery approximately once a week. The cell phone will alert you of an action which needs to be made.  On the cell phone, tap for details to reveal connection status, monitor battery status, and cell phone battery status. The green dots indicates your monitor is in good status. A red dot indicates there is something that needs your attention.  To record a symptom, click the circle  on the monitor battery. In 30-60 seconds a list of symptoms will appear on the cell phone. Select your symptom and tap save. Your monitor will record a sustained or significant arrhythmia regardless of you clicking the button. Some patients do not feel the heart rhythm irregularities. [employer] will notify us  of any serious or critical events.  Refer to instruction booklet for instructions on switching batteries, changing strips, the Do not disturb or Pause features, or any additional questions.  Call [employer] at [phone], to confirm your monitor is transmitting and record your baseline. They will answer any questions you may have regarding the monitor instructions at that time.  Returning the monitor to [employer]  Place all equipment back into blue box. Peel off strip of paper to expose  adhesive and close box securely. There is a prepaid UPS shipping label on this box. Drop in a UPS drop box, or at a UPS facility like [employer]. You may also contact [employer] to arrange UPS to pick up monitor package at your home.

## 2024-09-29 DIAGNOSIS — M5416 Radiculopathy, lumbar region: Secondary | ICD-10-CM | POA: Diagnosis not present

## 2024-10-02 ENCOUNTER — Telehealth: Payer: Self-pay | Admitting: Internal Medicine

## 2024-10-02 NOTE — Telephone Encounter (Signed)
 Pt c/o medication issue:  1. Name of Medication:  Eliquis   2. How are you currently taking this medication (dosage and times per day)?   3. Are you having a reaction (difficulty breathing--STAT)?   4. What is your medication issue?   Patient says she is having an epidural injection in spine with [facility] and would like to know if she needs to hold Eliquis . She says she just started it on Saturday. Per patient, [facility] has not requested clearance. Please advise.

## 2024-10-02 NOTE — Telephone Encounter (Signed)
 Pt made aware that [facility] office will need to send a clearance request to our office.  Informed that typically she will need to hold an blood thinner for spinal injection.  She will let their office know she has been started on a OAC.

## 2024-10-03 NOTE — Telephone Encounter (Signed)
 I called and left a voicemail on her answering machine. I gave her echo results and let her know to call us  back if she had any concerns or questions.   Per Dr. Kriste, Your echo showed that your heart has good squeezing function but is a little thickened and slow to relax. This could be causing your shortness of breath and swelling. I would recommend consistent blood pressure control with a goal < 130/80 mmHg and continue your current medications otherwise, including Lasix 

## 2024-10-17 DIAGNOSIS — I5032 Chronic diastolic (congestive) heart failure: Secondary | ICD-10-CM | POA: Diagnosis not present

## 2024-10-17 DIAGNOSIS — E66813 Obesity, class 3: Secondary | ICD-10-CM | POA: Diagnosis not present

## 2024-10-17 DIAGNOSIS — G8929 Other chronic pain: Secondary | ICD-10-CM | POA: Diagnosis not present

## 2024-10-17 DIAGNOSIS — M545 Low back pain, unspecified: Secondary | ICD-10-CM | POA: Diagnosis not present

## 2024-10-17 DIAGNOSIS — E1169 Type 2 diabetes mellitus with other specified complication: Secondary | ICD-10-CM | POA: Diagnosis not present

## 2024-10-17 DIAGNOSIS — I4892 Unspecified atrial flutter: Secondary | ICD-10-CM | POA: Diagnosis not present

## 2024-10-17 DIAGNOSIS — Z0000 Encounter for general adult medical examination without abnormal findings: Secondary | ICD-10-CM | POA: Diagnosis not present

## 2024-10-18 DIAGNOSIS — M5416 Radiculopathy, lumbar region: Secondary | ICD-10-CM | POA: Diagnosis not present

## 2024-11-06 ENCOUNTER — Encounter: Payer: Self-pay | Admitting: *Deleted

## 2024-11-07 ENCOUNTER — Ambulatory Visit: Payer: Self-pay | Admitting: Cardiology

## 2024-11-07 ENCOUNTER — Ambulatory Visit: Attending: Cardiology

## 2024-11-07 DIAGNOSIS — I4892 Unspecified atrial flutter: Secondary | ICD-10-CM | POA: Diagnosis not present

## 2024-11-08 ENCOUNTER — Encounter: Payer: Self-pay | Admitting: Internal Medicine

## 2024-11-08 ENCOUNTER — Ambulatory Visit: Attending: Internal Medicine | Admitting: Internal Medicine

## 2024-11-08 VITALS — BP 103/54 | HR 87 | Ht 62.0 in | Wt 274.0 lb

## 2024-11-08 DIAGNOSIS — I5032 Chronic diastolic (congestive) heart failure: Secondary | ICD-10-CM | POA: Insufficient documentation

## 2024-11-08 DIAGNOSIS — G8929 Other chronic pain: Secondary | ICD-10-CM

## 2024-11-08 DIAGNOSIS — E118 Type 2 diabetes mellitus with unspecified complications: Secondary | ICD-10-CM

## 2024-11-08 NOTE — Patient Instructions (Signed)
 Medication Instructions:   No changes  *If you need a refill on your cardiac medications before your next appointment, please call your pharmacy*   Lab Work: Not needed    Testing/Procedures:  Not needed  Follow-Up: At [facility], you and your health needs are our priority.  As part of our continuing mission to provide you with exceptional heart care, we have created designated Provider Care Teams.  These Care Teams include your primary Cardiologist (physician) and Advanced Practice Providers (APPs -  Physician Assistants and Nurse Practitioners) who all work together to provide you with the care you need, when you need it.     Your next appointment:   12 month(s)  The format for your next appointment:   In Person  Provider:   Emeline FORBES Calender, DO

## 2024-11-08 NOTE — Progress Notes (Signed)
 " Cardiology Office Note   Date:  11/08/2024  ID:  Heidi Byrd, Heidi Byrd Sep 04, 1946, MRN [id number] PCP: Dayna Motto, DO  [facility] Providers Cardiologist:  Emeline FORBES Calender, DO     History of Present Illness Heidi Byrd is a very pleasant 78 y.o. female who is a [employer] with a past medical history of hypertension, hyperlipidemia, type 2 diabetes, OSA, HFpEF, possible atrial flutter, obesity who was previously seen by Dr. Ronal Ross who was initially seen after referral from her PCP to do an abnormal echocardiogram and shortness of breath.  At her last visit with me on 08/29/2024, she was treated for an acute on chronic diastolic heart failure exacerbation and her Lasix  was increased to 40 mg daily.  She also was noted to have a high NSAID intake for back pain and was not taking her Eliquis  at the time.  She was recently seen by Thom Sluder, PA on 09/27/2024.  She noted significant improvement after diuresis.  A 30-day heart monitor was ordered to evaluate for atrial fibs/flutter burden given the patient's aversion to Eliquis .  The monitor did not show evidence of atrial fibrillation or flutter.  She returns today to discuss further management.  Today, she states she has stopped taking any NSAIDs for her back pain and has had 2 injections but these have not helped.  She is also starting with physical therapy.  Does have some exertional shortness of breath which she attributes to her weight.  She has not had any chest pain or palpitations and has been doing well with the Lasix  without any new complaints.  ROS:  Review of Systems  All other systems reviewed and are negative.   Physical Exam  Physical Exam Vitals and nursing note reviewed.  Constitutional:      Appearance: Normal appearance.  HENT:     Head: Normocephalic and atraumatic.  Eyes:     Conjunctiva/sclera: Conjunctivae normal.  Cardiovascular:     Rate and Rhythm: Normal rate and regular rhythm.   Pulmonary:     Effort: Pulmonary effort is normal.     Breath sounds: Normal breath sounds.  Musculoskeletal:        General: No swelling or tenderness.  Skin:    Coloration: Skin is not jaundiced or pale.  Neurological:     Mental Status: She is alert.     VS:  BP (!) 103/54   Pulse 87   Ht 5' 2 (1.575 m)   Wt 274 lb (124.3 kg)   SpO2 99%   BMI 50.12 kg/m         Wt Readings from Last 3 Encounters:  11/08/24 274 lb (124.3 kg)  09/27/24 276 lb (125.2 kg)  08/29/24 287 lb (130.2 kg)     EKG Interpretation Date/Time:    Ventricular Rate:    PR Interval:    QRS Duration:    QT Interval:    QTC Calculation:   R Axis:      Text Interpretation:      Studies Reviewed   Coronary CTA 10/05/2022: IMPRESSION: 1. Minimal mixed non-obstructive CAD, CADRADS = 1.   2. Coronary calcium score of 2.82. This was 30th percentile for age and sex matched control.   3. Normal coronary origin with left dominance.   4. Aortic atherosclerosis.   5. Consider non-coronary causes of chest pain.   Echocardiogram 10/16/2022:   1. Left ventricular ejection fraction, by estimation, is 60 to 65%. The  left ventricle has  normal function. The left ventricle has no regional  wall motion abnormalities. Left ventricular diastolic parameters are  consistent with Grade I diastolic  dysfunction (impaired relaxation).   2. Right ventricular systolic function is normal. The right ventricular  size is normal.   3. Left atrial size was mildly dilated.   4. The mitral valve is normal in structure. No evidence of mitral valve  regurgitation. No evidence of mitral stenosis.   5. The aortic valve is normal in structure. Aortic valve regurgitation is  not visualized. No aortic stenosis is present.   6. There is borderline dilatation of the ascending aorta, measuring 38  mm.   7. The inferior vena cava is normal in size with greater than 50%  respiratory variability, suggesting right atrial  pressure of 3 mmHg.    Echocardiogram 08/17/2024: Grade 1 diastolic dysfunction with poor visualization of the left ventricle Trace TR Mildly thickened pericardium  Risk Assessment/Calculations    ASCVD risk score: The ASCVD Risk score (Arnett DK, et al., 2019) failed to calculate for the following reasons:   Cannot find a previous total cholesterol lab   ASSESSMENT  Chronic diastolic heart failure on Lasix  40 mg daily.  Currently euvolemic.  SGLT2 inhibitors contraindication due to recurrent yeast infections on this medicine in the past.  Has had side effects to semaglutide  Questionable atrial flutter likely has a very low atrial flutter burden as she only apparently has 1 EKG in the past showing atrial flutter from June 2023 however I cannot seem to find it.  All of her other EKGs before and after have been normal sinus rhythm and she does not have any symptoms.  30-day heart monitor does not show any atrial fibrillation or flutter.  Given this low burden and the fact that she is a [employer] who cannot accept blood transfusions in the event of severe bleeding as well as the need for potential NSAID use I believe her risk of anticoagulation outweighs the risk of stroke at this time.  Therefore will stop Eliquis .  We discussed this and patient is in agreement. Hypertension typically well-controlled at home Hyperlipidemia Type 2 diabetes Obesity Exertional shortness of breath, likely due to deconditioning [employer] Chronic pain and NSAID use for low back pain has not been taking NSAIDs since our last discussion.  May benefit from gabapentin  for described neurologic pain.  Will defer to PCP.   Plan  Continue Lasix  40 mg daily Will discontinue Eliquis  as described above Discussed weight loss and lifestyle modification Will defer chronic pain management to PCP  Follow up: 1 year          Signed, Emeline FORBES Calender, DO   "
